# Patient Record
Sex: Male | Born: 1965 | Race: White | Hispanic: No | Marital: Married | Smoking: Never smoker
Health system: Southern US, Community
[De-identification: ages and names within clinical notes are randomized; demographics above are authoritative.]

## PROBLEM LIST (undated history)

## (undated) DIAGNOSIS — J45909 Unspecified asthma, uncomplicated: Secondary | ICD-10-CM

## (undated) DIAGNOSIS — T7840XA Allergy, unspecified, initial encounter: Secondary | ICD-10-CM

## (undated) DIAGNOSIS — R4681 Obsessive-compulsive behavior: Secondary | ICD-10-CM

---

## 2005-07-14 ENCOUNTER — Ambulatory Visit: Payer: Self-pay | Admitting: Gastroenterology

## 2009-06-30 DIAGNOSIS — D239 Other benign neoplasm of skin, unspecified: Secondary | ICD-10-CM

## 2009-06-30 HISTORY — DX: Other benign neoplasm of skin, unspecified: D23.9

## 2010-01-12 DIAGNOSIS — C4491 Basal cell carcinoma of skin, unspecified: Secondary | ICD-10-CM

## 2010-01-12 HISTORY — DX: Basal cell carcinoma of skin, unspecified: C44.91

## 2014-02-17 ENCOUNTER — Ambulatory Visit (INDEPENDENT_AMBULATORY_CARE_PROVIDER_SITE_OTHER): Payer: BC Managed Care – PPO | Admitting: Podiatry

## 2014-02-17 ENCOUNTER — Encounter: Payer: Self-pay | Admitting: Podiatry

## 2014-02-17 ENCOUNTER — Ambulatory Visit (INDEPENDENT_AMBULATORY_CARE_PROVIDER_SITE_OTHER): Payer: BC Managed Care – PPO

## 2014-02-17 ENCOUNTER — Ambulatory Visit: Payer: Self-pay | Admitting: Podiatry

## 2014-02-17 VITALS — BP 131/78 | HR 82 | Resp 16 | Ht 70.5 in | Wt 190.0 lb

## 2014-02-17 DIAGNOSIS — M21629 Bunionette of unspecified foot: Secondary | ICD-10-CM

## 2014-02-17 DIAGNOSIS — M205X9 Other deformities of toe(s) (acquired), unspecified foot: Secondary | ICD-10-CM

## 2014-02-17 DIAGNOSIS — M722 Plantar fascial fibromatosis: Secondary | ICD-10-CM

## 2014-02-17 NOTE — Progress Notes (Signed)
Patient ID: Shane Pacheco, male   DOB: 03/13/1965, 48 y.o.   MRN: [id number]  SUBJECTIVE: 48 year old male returns the office today requesting new orthotics. The patient states that previously he had been treated for plantar fasciitis and had orthotics made. He states the orthotics are worn out and likely new.. He states that he does not wear the orthotics he has no pain. He denies any pain at this time. The patient also states that at the end of the day he has pain on the outside aspects of his feet periodically for which she points to the fifth metatarsal head bilaterally. He states that the pain does not limit his activity and is intermittent. Denies any swelling or overall redness or warmth to the area. Denies any recent injury or trauma to the area. No other complaints at this time. Denies any systemic complaints such as fevers, chills, nausea, vomiting.  Objective: AAO x3, NAD DP/PT pulses palpable bilaterally, CRT less than 3 seconds Protective sensation intact with Simms Weinstein monofilament, vibratory sensation intact, Achilles tendon reflex intact No tenderness to palpation overlying the plantar medial tubercle of the calcaneus at the insertion of the plantar fascia was on the course of the plantar fascia. No pain with lateral compression of the calcaneus or pain with vibratory sensation. No pain along the posterior aspect of the calcaneus or along the course last insertion of the Achilles tendon. There is currently no tenderness to palpation overlying the fifth metatarsal head bilaterally. 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 No overlying edema, erythema, increase in warmth to the area. No areas of pinpoint bony tenderness or pain with vibratory sensation. MMT 5/5, ROM WNL No pain with calf compression, swelling, warmth, erythema. No open lesions or pre-ulcerative lesions  Assessment: 48 year old male with bilateral plantar fasciitis am a Production designer, theatre/television/film bunion  Plan: -X-rays  were obtained and the patient. -Treatment options were discussed including alternatives, risks, complications. -At this time, the patient is requesting custom orthotics. The patient was scanned for orthotics and sent to [facility] labs. -Discussed various padding/offloading options. -Follow-up once the new orthotics arrive. The meantime, call the office any questions, concerns, change in symptoms.

## 2014-03-17 ENCOUNTER — Ambulatory Visit: Payer: BC Managed Care – PPO | Admitting: Podiatry

## 2014-03-26 ENCOUNTER — Ambulatory Visit (INDEPENDENT_AMBULATORY_CARE_PROVIDER_SITE_OTHER): Payer: BLUE CROSS/BLUE SHIELD | Admitting: Podiatry

## 2014-03-26 DIAGNOSIS — M722 Plantar fascial fibromatosis: Secondary | ICD-10-CM

## 2014-03-26 NOTE — Patient Instructions (Signed)

## 2014-03-30 NOTE — Progress Notes (Signed)
Patient ID: Shane Pacheco, male   DOB: [birth year], 49 y.o.   MRN: [id number]  Patient presents today to pick up orthotics. He is offered to see myself however he declined he was seen by the nurse to dispense orthotics. No new complaints at this time and no complaints at this time. The appropriate break-in period was discussed with the patient. Follow-up in 4 weeks or sooner should any palms arise. Encouraged to call the office with any questions, concerns, change in symptoms.

## 2014-10-28 ENCOUNTER — Other Ambulatory Visit: Payer: Self-pay | Admitting: Medical

## 2014-10-28 ENCOUNTER — Ambulatory Visit
Admission: RE | Admit: 2014-10-28 | Discharge: 2014-10-28 | Disposition: A | Discharge status: Home / Self Care | Payer: BLUE CROSS/BLUE SHIELD | Source: Ambulatory Visit | Attending: Medical | Admitting: Medical

## 2014-10-28 DIAGNOSIS — R1011 Right upper quadrant pain: Secondary | ICD-10-CM | POA: Insufficient documentation

## 2015-02-18 ENCOUNTER — Encounter: Payer: Self-pay | Admitting: *Deleted

## 2015-02-19 ENCOUNTER — Ambulatory Visit: Payer: BLUE CROSS/BLUE SHIELD | Admitting: Certified Registered Nurse Anesthetist

## 2015-02-19 ENCOUNTER — Encounter
Admission: RE | Disposition: A | Discharge status: Home / Self Care | Payer: Self-pay | Source: Ambulatory Visit | Attending: Unknown Physician Specialty

## 2015-02-19 ENCOUNTER — Ambulatory Visit
Admission: RE | Admit: 2015-02-19 | Discharge: 2015-02-19 | Disposition: A | Discharge status: Home / Self Care | Payer: BLUE CROSS/BLUE SHIELD | Source: Ambulatory Visit | Attending: Unknown Physician Specialty | Admitting: Unknown Physician Specialty

## 2015-02-19 ENCOUNTER — Encounter: Payer: Self-pay | Admitting: Anesthesiology

## 2015-02-19 DIAGNOSIS — J45909 Unspecified asthma, uncomplicated: Secondary | ICD-10-CM | POA: Insufficient documentation

## 2015-02-19 DIAGNOSIS — F428 Other obsessive-compulsive disorder: Secondary | ICD-10-CM | POA: Diagnosis not present

## 2015-02-19 DIAGNOSIS — K640 First degree hemorrhoids: Secondary | ICD-10-CM | POA: Diagnosis not present

## 2015-02-19 DIAGNOSIS — Z882 Allergy status to sulfonamides status: Secondary | ICD-10-CM | POA: Insufficient documentation

## 2015-02-19 DIAGNOSIS — Z79899 Other long term (current) drug therapy: Secondary | ICD-10-CM | POA: Diagnosis not present

## 2015-02-19 DIAGNOSIS — Z1211 Encounter for screening for malignant neoplasm of colon: Secondary | ICD-10-CM | POA: Diagnosis present

## 2015-02-19 DIAGNOSIS — Z8371 Family history of colonic polyps: Secondary | ICD-10-CM | POA: Diagnosis not present

## 2015-02-19 HISTORY — DX: Unspecified asthma, uncomplicated: J45.909

## 2015-02-19 HISTORY — PX: COLONOSCOPY WITH PROPOFOL: SHX5780

## 2015-02-19 HISTORY — DX: Allergy, unspecified, initial encounter: T78.40XA

## 2015-02-19 HISTORY — DX: Obsessive-compulsive behavior: R46.81

## 2015-02-19 SURGERY — COLONOSCOPY WITH PROPOFOL
Anesthesia: General

## 2015-02-19 MED ORDER — FENTANYL CITRATE (PF) 100 MCG/2ML IJ SOLN
INTRAMUSCULAR | Status: DC | PRN
  Administered 2015-02-19: 50 ug via INTRAVENOUS

## 2015-02-19 MED ORDER — SODIUM CHLORIDE 0.9 % IV SOLN
INTRAVENOUS | Status: DC
  Administered 2015-02-19: 08:00:00 via INTRAVENOUS

## 2015-02-19 MED ORDER — SODIUM CHLORIDE 0.9 % IV SOLN: INTRAVENOUS | Status: DC

## 2015-02-19 MED ORDER — MIDAZOLAM HCL 2 MG/2ML IJ SOLN
INTRAMUSCULAR | Status: DC | PRN
  Administered 2015-02-19: 1 mg via INTRAVENOUS

## 2015-02-19 MED ORDER — PROPOFOL 10 MG/ML IV BOLUS
INTRAVENOUS | Status: DC | PRN
  Administered 2015-02-19: 10 mg via INTRAVENOUS
  Administered 2015-02-19: 20 mg via INTRAVENOUS
  Administered 2015-02-19: 10 mg via INTRAVENOUS

## 2015-02-19 MED ORDER — PROPOFOL 500 MG/50ML IV EMUL
INTRAVENOUS | Status: DC | PRN
  Administered 2015-02-19: 140 ug/kg/min via INTRAVENOUS

## 2015-02-19 MED ORDER — LIDOCAINE HCL (CARDIAC) 20 MG/ML IV SOLN
INTRAVENOUS | Status: DC | PRN
  Administered 2015-02-19: 100 mg via INTRAVENOUS

## 2015-02-19 NOTE — Op Note (Signed)
[facility] Gastroenterology Patient Name: Taiyo Kuder Procedure Date: 02/19/2015 8:01 AM MRN: [id number] Account #: [id number] Date of Birth: [birth year] Admit Type: Outpatient Age: 49 Room: [facility] ENDO ROOM 4 Gender: Male Note Status: Finalized Procedure:         Colonoscopy Indications:       Screening for colorectal malignant neoplasm Providers:         Manya Silvas, MD Referring MD:      Irven Easterly. Kary Kos, MD (Referring MD) Medicines:         Propofol per Anesthesia Complications:     No immediate complications. Procedure:         Pre-Anesthesia Assessment:                    - After reviewing the risks and benefits, the patient was                     deemed in satisfactory condition to undergo the procedure.                    After obtaining informed consent, the colonoscope was                     passed under direct vision. Throughout the procedure, the                     patient's blood pressure, pulse, and oxygen saturations                     were monitored continuously. The Colonoscope was                     introduced through the anus and advanced to the the cecum,                     identified by appendiceal orifice and ileocecal valve. The                     colonoscopy was performed without difficulty. The patient                     tolerated the procedure well. The quality of the bowel                     preparation was excellent. Findings:      Internal hemorrhoids were found during endoscopy. The hemorrhoids were       small and Grade I (internal hemorrhoids that do not prolapse).      Biopsies done of sigmoid colon for history of some diarrhea.      The exam was otherwise without abnormality. Impression:        - Internal hemorrhoids.                    - The examination was otherwise normal.                    - No specimens collected. Recommendation:    - Await pathology results.                    - Repeat colonoscopy in 5  years for screening purposes. Manya Silvas, MD 02/19/2015 8:27:31 AM This report has been signed electronically. Number of Addenda: 0 Note Initiated On: 02/19/2015 8:01 AM Scope Withdrawal Time: 0 hours 10  minutes 33 seconds  Total Procedure Duration: 0 hours 17 minutes 37 seconds       [facility]

## 2015-02-19 NOTE — Anesthesia Preprocedure Evaluation (Signed)
Anesthesia Evaluation  Patient identified by MRN, date of birth, ID band Patient awake    Reviewed: Allergy & Precautions, H&P , NPO status , Patient's Chart, lab work & pertinent test results  History of Anesthesia Complications Negative for: history of anesthetic complications  Airway Mallampati: III  TM Distance: >3 FB Neck ROM: full    Dental no notable dental hx. (+) Teeth Intact   Pulmonary neg shortness of breath, asthma ,    Pulmonary exam normal breath sounds clear to auscultation       Cardiovascular Exercise Tolerance: Good (-) angina(-) Past MI and (-) DOE negative cardio ROS Normal cardiovascular exam Rhythm:regular Rate:Normal     Neuro/Psych negative neurological ROS  negative psych ROS   GI/Hepatic Neg liver ROS, GERD  Controlled,  Endo/Other  negative endocrine ROS  Renal/GU negative Renal ROS  negative genitourinary   Musculoskeletal   Abdominal   Peds  Hematology negative hematology ROS (+)   Anesthesia Other Findings Past Medical History:   Allergic state                                               Asthma without status asthmaticus                            Obsessive behavior                                          History reviewed. No pertinent surgical history.     Reproductive/Obstetrics negative OB ROS                             Anesthesia Physical Anesthesia Plan  ASA: III  Anesthesia Plan: General   Post-op Pain Management:    Induction:   Airway Management Planned:   Additional Equipment:   Intra-op Plan:   Post-operative Plan:   Informed Consent: I have reviewed the patients History and Physical, chart, labs and discussed the procedure including the risks, benefits and alternatives for the proposed anesthesia with the patient or authorized representative who has indicated his/her understanding and acceptance.   Dental Advisory  Given  Plan Discussed with: Anesthesiologist, CRNA and Surgeon  Anesthesia Plan Comments:         Anesthesia Quick Evaluation

## 2015-02-19 NOTE — Anesthesia Postprocedure Evaluation (Signed)
Anesthesia Post Note  Patient: Shane Pacheco  Procedure(s) Performed: Procedure(s) (LRB): COLONOSCOPY WITH PROPOFOL (N/A)  Patient location during evaluation: Endoscopy Anesthesia Type: General Level of consciousness: awake and alert Pain management: pain level controlled Vital Signs Assessment: post-procedure vital signs reviewed and stable Respiratory status: spontaneous breathing, nonlabored ventilation, respiratory function stable and patient connected to nasal cannula oxygen Cardiovascular status: blood pressure returned to baseline and stable Postop Assessment: no signs of nausea or vomiting Anesthetic complications: no    Last Vitals:  Filed Vitals:   02/19/15 0850 02/19/15 0900  BP: 118/73 123/68  Pulse: 67 66  Temp:    Resp: 13 16    Last Pain: There were no vitals filed for this visit.               Precious Haws Tucker Minter

## 2015-02-19 NOTE — H&P (Addendum)
Primary Care Physician:  Maryland Pink, MD Primary Gastroenterologist:  Dr. Vira Agar  Pre-Procedure History & Physical: HPI:  Shane Pacheco is a 49 y.o. male is here for an colonoscopy.   Past Medical History  Diagnosis Date  . Allergic state   . Asthma without status asthmaticus   . Obsessive behavior     History reviewed. No pertinent past surgical history.  Prior to Admission medications   Medication Sig Start Date End Date Taking? Authorizing Provider  Azelastine HCl (ASTEPRO) 0.15 % SOLN  07/08/10  Yes Historical Provider, MD  Fexofenadine HCl (ALLEGRA PO) Take by mouth daily.   Yes Historical Provider, MD  FLUoxetine (PROZAC) 20 MG tablet Take 20 mg by mouth daily.   Yes Historical Provider, MD  montelukast (SINGULAIR) 10 MG tablet  06/04/10  Yes Historical Provider, MD  pantoprazole (PROTONIX) 40 MG tablet  12/06/13  Yes Historical Provider, MD  mometasone (NASONEX) 50 MCG/ACT nasal spray  06/04/10   Historical Provider, MD    Allergies as of 09/25/2014 - Review Complete 02/17/2014  Allergen Reaction Noted  . Sulfa antibiotics Other (See Comments) 02/17/2014    History reviewed. No pertinent family history.  Social History   Social History  . Marital Status: Married    Spouse Name: N/A  . Number of Children: N/A  . Years of Education: N/A   Occupational History  . Not on file.   Social History Main Topics  . Smoking status: Never Smoker   . Smokeless tobacco: Not on file  . Alcohol Use: 0.0 oz/week    0 Standard drinks or equivalent per week  . Drug Use: Not on file  . Sexual Activity: Not on file   Other Topics Concern  . Not on file   Social History Narrative    Review of Systems: See HPI, otherwise negative ROS  Physical Exam: BP 140/77 mmHg  Pulse 80  Temp(Src) 98.5 F (36.9 C) (Oral)  Resp 17  SpO2 98% General:   Alert,  pleasant and cooperative in NAD Head:  Normocephalic and atraumatic. Neck:  Supple; no masses or  thyromegaly. Lungs:  Clear throughout to auscultation.    Heart:  Regular rate and rhythm. Abdomen:  Soft, nontender and nondistended. Normal bowel sounds, without guarding, and without rebound.   Neurologic:  Alert and  oriented x4;  grossly normal neurologically.  Impression/Plan: Shane Pacheco is here for an colonoscopy to be performed for screening   Risks, benefits, limitations, and alternatives regarding  colonoscopy have been reviewed with the patient.  Questions have been answered.  All parties agreeable.   Gaylyn Cheers, MD  02/19/2015, 7:51 AM    Primary Care Physician:  Maryland Pink, MD Primary Gastroenterologist:  Dr. Vira Agar  Pre-Procedure History & Physical: HPI:  Shane Pacheco is a 49 y.o. male is here for an colonoscopy.   Past Medical History  Diagnosis Date  . Allergic state   . Asthma without status asthmaticus   . Obsessive behavior     History reviewed. No pertinent past surgical history.  Prior to Admission medications   Medication Sig Start Date End Date Taking? Authorizing Provider  Azelastine HCl (ASTEPRO) 0.15 % SOLN  07/08/10  Yes Historical Provider, MD  Fexofenadine HCl (ALLEGRA PO) Take by mouth daily.   Yes Historical Provider, MD  FLUoxetine (PROZAC) 20 MG tablet Take 20 mg by mouth daily.   Yes Historical Provider, MD  montelukast (SINGULAIR) 10 MG tablet  06/04/10  Yes Historical Provider, MD  pantoprazole ([place])  40 MG tablet  12/06/13  Yes Historical Provider, MD  mometasone (NASONEX) 50 MCG/ACT nasal spray  06/04/10   Historical Provider, MD    Allergies as of 09/25/2014 - Review Complete 02/17/2014  Allergen Reaction Noted  . Sulfa antibiotics Other (See Comments) 02/17/2014    History reviewed. No pertinent family history.  Social History   Social History  . Marital Status: Married    Spouse Name: N/A  . Number of Children: N/A  . Years of Education: N/A   Occupational History  . Not on file.   Social History Main  Topics  . Smoking status: Never Smoker   . Smokeless tobacco: Not on file  . Alcohol Use: 0.0 oz/week    0 Standard drinks or equivalent per week  . Drug Use: Not on file  . Sexual Activity: Not on file   Other Topics Concern  . Not on file   Social History Narrative    Review of Systems: See HPI, otherwise negative ROS  Physical Exam: BP 140/77 mmHg  Pulse 80  Temp(Src) 98.5 F (36.9 C) (Oral)  Resp 17  SpO2 98% General:   Alert,  pleasant and cooperative in NAD Head:  Normocephalic and atraumatic. Neck:  Supple; no masses or thyromegaly. Lungs:  Clear throughout to auscultation.    Heart:  Regular rate and rhythm. Abdomen:  Soft, nontender and nondistended. Normal bowel sounds, without guarding, and without rebound.   Neurologic:  Alert and  oriented x4;  grossly normal neurologically.  Impression/Plan: Shane Pacheco is here for an colonoscopy to be performed for screening, FH colon polyps in mother.  Risks, benefits, limitations, and alternatives regarding  colonoscopy have been reviewed with the patient.  Questions have been answered.  All parties agreeable.   Gaylyn Cheers, MD  02/19/2015, 7:51 AM

## 2015-02-19 NOTE — Transfer of Care (Signed)
Immediate Anesthesia Transfer of Care Note  Patient: Shane Pacheco  Procedure(s) Performed: Procedure(s): COLONOSCOPY WITH PROPOFOL (N/A)  Patient Location: PACU  Anesthesia Type:General  Level of Consciousness: sedated  Airway & Oxygen Therapy: Patient Spontanous Breathing and Patient connected to nasal cannula oxygen  Post-op Assessment: Report given to RN and Post -op Vital signs reviewed and stable  Post vital signs: Reviewed and stable  Last Vitals:  Filed Vitals:   02/19/15 0744  BP: 140/77  Pulse: 80  Temp: 36.9 C  Resp: 17    Complications: No apparent anesthesia complications

## 2015-02-19 NOTE — Anesthesia Procedure Notes (Signed)
Date/Time: 02/19/2015 8:03 AM Performed by: Johnna Acosta Pre-anesthesia Checklist: Patient identified, Emergency Drugs available, Suction available, Patient being monitored and Timeout performed Patient Re-evaluated:Patient Re-evaluated prior to inductionOxygen Delivery Method: Nasal cannula

## 2015-02-23 LAB — SURGICAL PATHOLOGY

## 2015-02-24 ENCOUNTER — Encounter: Payer: Self-pay | Admitting: Unknown Physician Specialty

## 2016-09-05 IMAGING — CR DG ABDOMEN 1V
1 series · 2 of 2 positions shown · non-contrast
Comparison: None.

CLINICAL DATA: Right-sided abdomen pain for 4 days

EXAM:
ABDOMEN - 1 VIEW

[Series 1: dg abd 1 view · 0.14mm/px · 2 of 2 slices shown]
[im 1/2]
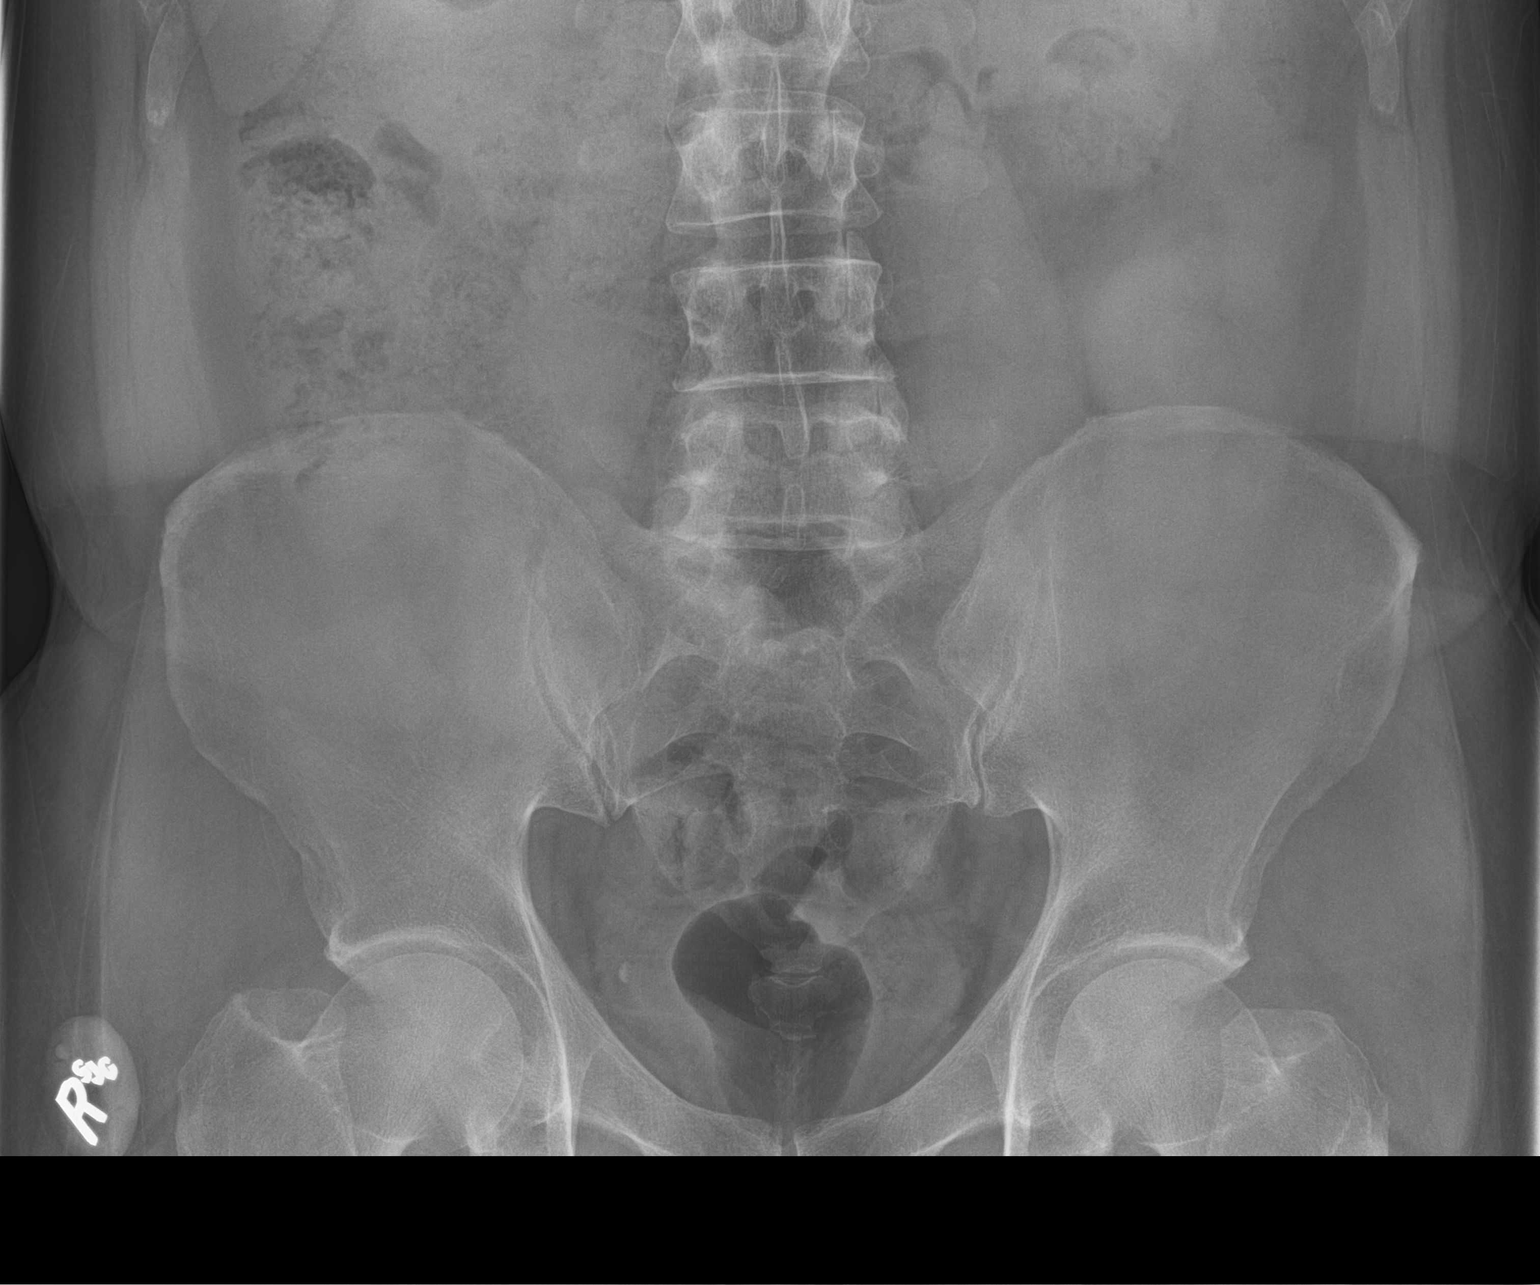
[im 2/2]
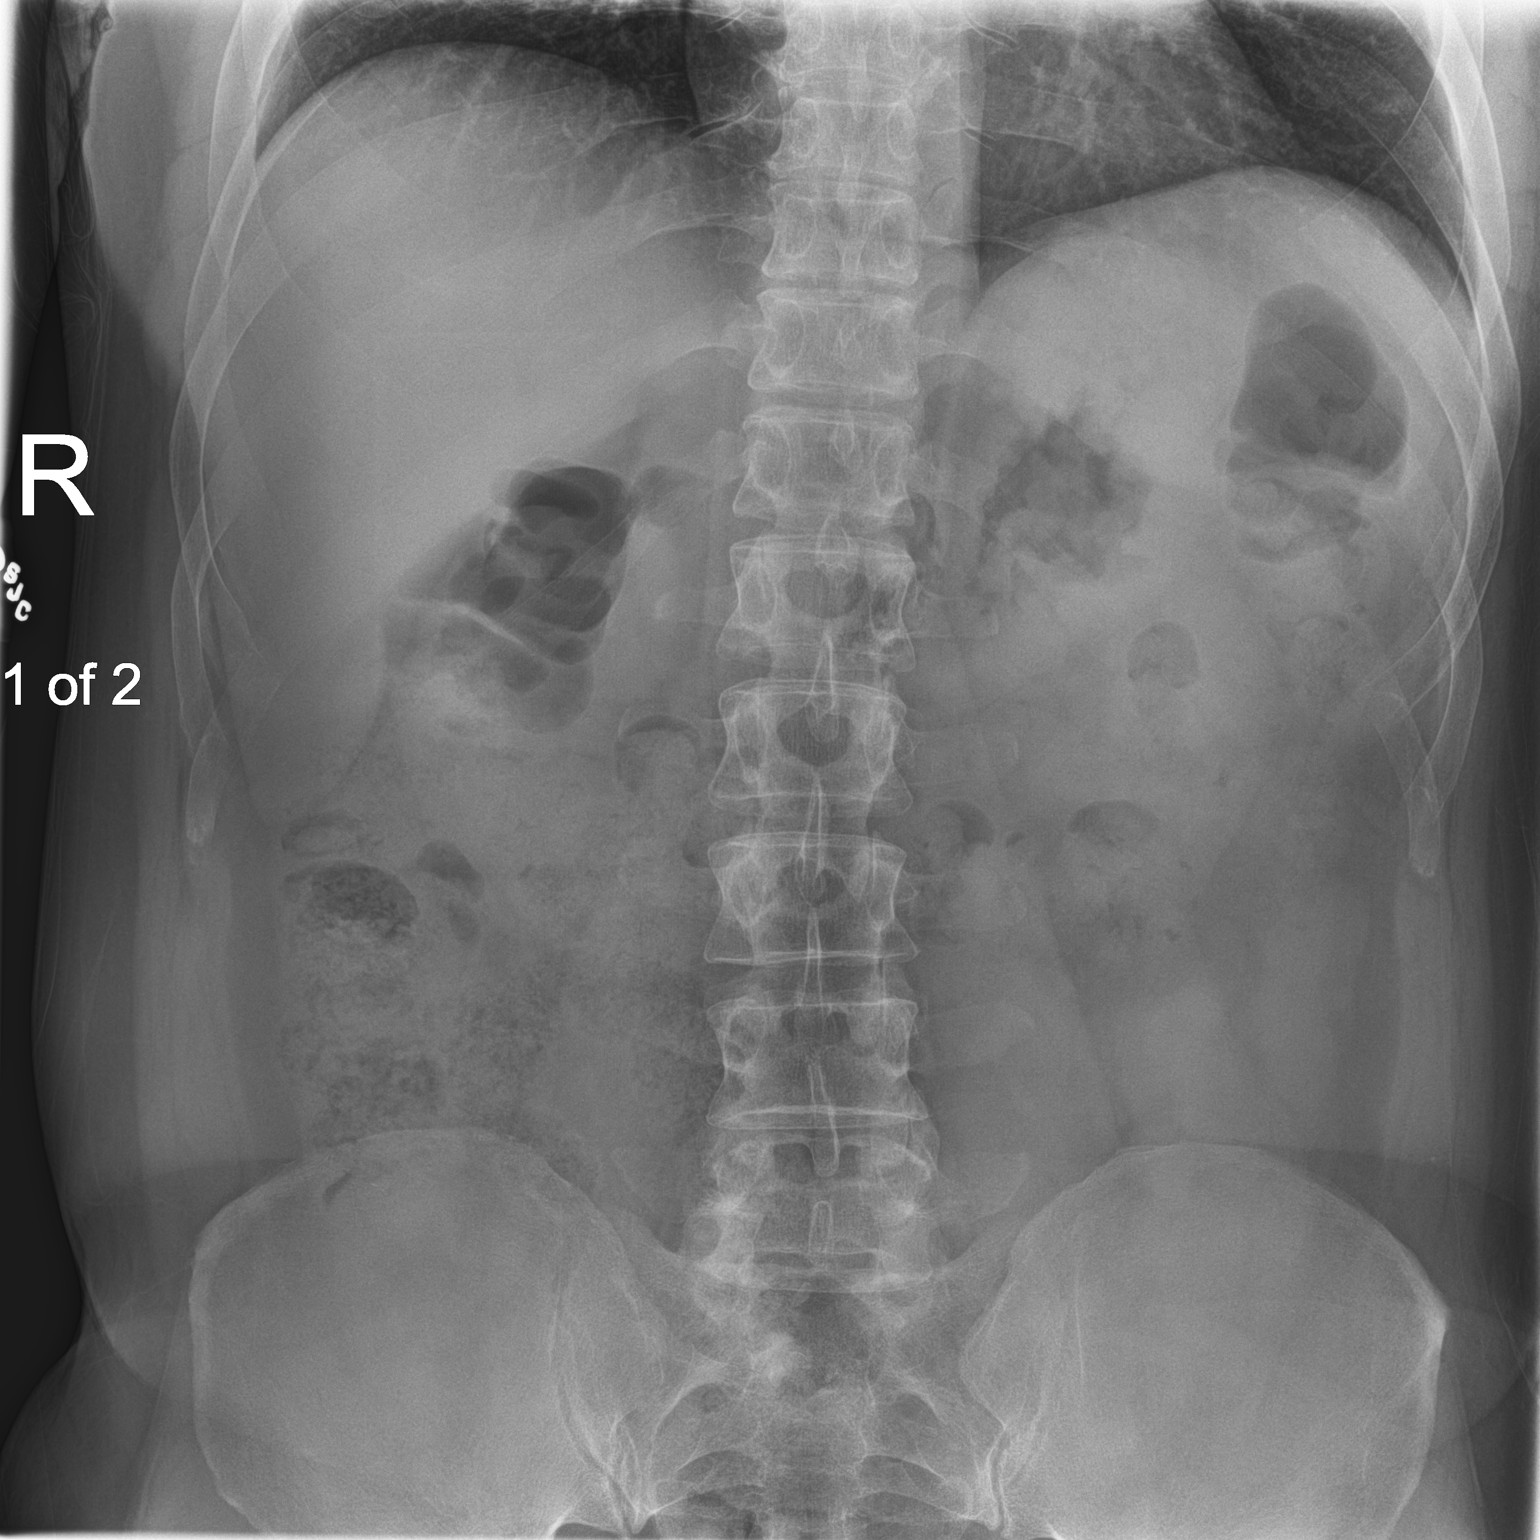

[2 of 2 positions shown; findings below may reference images not displayed]

FINDINGS: Supine views of the abdomen show no bowel obstruction. Only a mild
to moderate amount of feces is noted throughout the colon. No opaque
calculi are seen. No bony abnormality is noted.
IMPRESSION: No bowel obstruction.  No radiographic evidence of constipation.

## 2016-12-29 ENCOUNTER — Encounter: Payer: Self-pay | Admitting: Adult Health

## 2016-12-29 ENCOUNTER — Ambulatory Visit: Payer: BLUE CROSS/BLUE SHIELD | Admitting: Adult Health

## 2016-12-29 VITALS — BP 150/94 | HR 79 | Temp 98.0°F | Resp 16 | Wt 194.0 lb

## 2016-12-29 DIAGNOSIS — J0100 Acute maxillary sinusitis, unspecified: Secondary | ICD-10-CM

## 2016-12-29 MED ORDER — AMOXICILLIN-POT CLAVULANATE 875-125 MG PO TABS: 1.0000 | ORAL_TABLET | Freq: Two times a day (BID) | ORAL | 0 refills | Status: DC

## 2016-12-29 NOTE — Progress Notes (Addendum)
Subjective:     Patient ID: Shane Pacheco, male   DOB: [birth year], 51 y.o.   MRN: [id number]  HPI  Patient is a 51 year old male in no acute distress who presents with a head cold over two weeks ago. [facility] with [facility] throat took Prednisone 2 tablets daily for 5 days. Minimal nasal discharge yellow/ green mostly clear. Sore throat.   He has been taking Mucinex 600 mg QID.  Taking Nyquil at night. He denies any history of chronic sinusitis or any recent illness or hospitalizations.   Blood pressure (!) 150/94, pulse 79, temperature 98 F (36.7 C), resp. rate 16, weight 194 lb (88 kg), SpO2 99 %.  Recheck B/P 136/72.  He is advised to monitor at home and report to Maryland Pink, MD. He reports his blood pressure is usually 126/70.    Allergies  Allergen Reactions  . Sulfa Antibiotics Other (See Comments)    Current Outpatient Medications:  .  Azelastine HCl (ASTEPRO) 0.15 % SOLN, , Disp: , Rfl:  .  Fexofenadine HCl (ALLEGRA PO), Take by mouth daily., Disp: , Rfl:  .  FLUoxetine (PROZAC) 20 MG tablet, Take 20 mg by mouth daily., Disp: , Rfl:  .  levalbuterol (XOPENEX HFA) 45 MCG/ACT inhaler, , Disp: , Rfl:  .  mometasone (NASONEX) 50 MCG/ACT nasal spray, , Disp: , Rfl:  .  montelukast (SINGULAIR) 10 MG tablet, , Disp: , Rfl:  .  montelukast (SINGULAIR) 10 MG tablet, , Disp: , Rfl:  .  pantoprazole (PROTONIX) 40 MG tablet, , Disp: , Rfl: 1 .  Multiple Vitamin (MULTIVITAMIN) capsule, Take by mouth., Disp: , Rfl:  .  Omega-3 Fatty Acids (FISH OIL PO), Take by mouth., Disp: , Rfl:  .  predniSONE (DELTASONE) 20 MG tablet, TAKE 2 TABLETS BY MOUTH EVERY DAY FOR 5 DAYS, Disp: , Rfl: 0  Review of Systems  Constitutional: Negative.   HENT: Positive for congestion, sinus pressure, sore throat and voice change (hoarse ).   Eyes: Negative.   Respiratory: Negative.   Cardiovascular: Negative.   Gastrointestinal: Negative.   Endocrine: Negative.    Genitourinary: Negative.   Musculoskeletal: Negative.   Skin: Negative.   Allergic/Immunologic: Positive for environmental allergies. Negative for food allergies and immunocompromised state.       Environmental allergies current treatment regimen controlling   Neurological: Negative.   Hematological: Negative.  Negative for adenopathy.  Psychiatric/Behavioral: Negative.        Objective:   Physical Exam  Constitutional: He is oriented to person, place, and time. Vital signs are normal. He appears well-developed and well-nourished. He is active.  Non-toxic appearance. He does not have a sickly appearance. He does not appear ill. No distress.  HENT:  Head: Normocephalic and atraumatic.  Right Ear: Hearing, external ear and ear canal normal. Tympanic membrane is not perforated and not erythematous.  Left Ear: Hearing, tympanic membrane, external ear and ear canal normal. Tympanic membrane is not perforated and not erythematous.  Nose: Mucosal edema and rhinorrhea present. No nose lacerations, sinus tenderness or nasal deformity. Right sinus exhibits maxillary sinus tenderness. Right sinus exhibits no frontal sinus tenderness. Left sinus exhibits maxillary sinus tenderness. Left sinus exhibits no frontal sinus tenderness.  Mouth/Throat: Uvula is midline and mucous membranes are normal. Mucous membranes are not pale, not dry and not cyanotic. No oral lesions. No uvula swelling. Posterior oropharyngeal erythema present. No oropharyngeal exudate, posterior oropharyngeal edema or tonsillar abscesses.  Eyes: Conjunctivae, EOM and lids are normal. Pupils are equal, round, and reactive to light. Right eye exhibits no discharge. Left eye exhibits no discharge. No scleral icterus.  Neck: Trachea normal, normal range of motion, full passive range of motion without pain and phonation normal. Neck supple. Normal carotid pulses, no hepatojugular reflux and no JVD present. No tracheal tenderness present.  Carotid bruit is not present. No tracheal deviation present. No Brudzinski's sign noted. No thyromegaly present.  Cardiovascular: Normal rate, regular rhythm, S1 normal, S2 normal, normal heart sounds, intact distal pulses and normal pulses. Exam reveals no gallop, no distant heart sounds and no friction rub.  No murmur heard. Pulmonary/Chest: Effort normal and breath sounds normal. No accessory muscle usage or stridor. No respiratory distress. He has no wheezes. He has no rales. He exhibits no tenderness.  Abdominal: Soft. Normal appearance, normal aorta and bowel sounds are normal. He exhibits no distension and no mass. There is no tenderness. There is no rebound and no guarding.  Musculoskeletal: Normal range of motion. He exhibits no edema, tenderness or deformity.  Patient moves on and off of exam table and in room without difficulty. Gait is normal in hall and in room. Patient is oriented to person place time and situation. Patient answers questions appropriately and engages in conversation.   Lymphadenopathy:       Head (right side): No submental, no submandibular, no tonsillar, no preauricular, no posterior auricular and no occipital adenopathy present.       Head (left side): No submental, no submandibular, no tonsillar, no preauricular, no posterior auricular and no occipital adenopathy present.    He has no cervical adenopathy.    He has no axillary adenopathy.  Neurological: He is alert and oriented to person, place, and time. He has normal strength and normal reflexes. No cranial nerve deficit or sensory deficit. He exhibits normal muscle tone. He displays a negative Romberg sign. Coordination normal. GCS eye subscore is 4. GCS verbal subscore is 5. GCS motor subscore is 6.  Skin: Skin is warm, dry and intact. No rash noted. He is not diaphoretic. No cyanosis or erythema. No pallor. Nails show no clubbing.  Psychiatric: He has a normal mood and affect. His speech is normal and behavior is  normal. Judgment and thought content normal. Cognition and memory are normal.       Assessment:  Acute non-recurrent maxillary sinusitis       Plan:     Meds ordered this encounter  Medications  .  .   .    .   .  Marland Kitchen amoxicillin-clavulanate (AUGMENTIN) 875-125 MG tablet    Sig: Take 1 tablet 2 (two) times daily by mouth.    Dispense:  20 tablet    Refill:  0   Augmentin only E- prescribed as above.   Advised to return to clinic for an appointment if no improvement within 72 hours or if any symptoms change or worsen. Advised ER or urgent Care if after hours or on weekend. 911 for emergency symptoms at any time. You should not worsen from today forward and should seek care if you do.   Patient verbalized understanding of all instructions given and denies any further questions at this time.

## 2016-12-29 NOTE — Patient Instructions (Signed)
Sinusitis, Adult Sinusitis is soreness and inflammation of your sinuses. Sinuses are hollow spaces in the bones around your face. They are located:  Around your eyes.  In the middle of your forehead.  Behind your nose.  In your cheekbones.  Your sinuses and nasal passages are lined with a stringy fluid (mucus). Mucus normally drains out of your sinuses. When your nasal tissues get inflamed or swollen, the mucus can get trapped or blocked so air cannot flow through your sinuses. This lets bacteria, viruses, and funguses grow, and that leads to infection. Follow these instructions at home: Medicines  Take, use, or apply over-the-counter and prescription medicines only as told by your doctor. These may include nasal sprays.  If you were prescribed an antibiotic medicine, take it as told by your doctor. Do not stop taking the antibiotic even if you start to feel better. Hydrate and Humidify  Drink enough water to keep your pee (urine) clear or pale yellow.  Use a cool mist humidifier to keep the humidity level in your home above 50%.  Breathe in steam for 10-15 minutes, 3-4 times a day or as told by your doctor. You can do this in the bathroom while a hot shower is running.  Try not to spend time in cool or dry air. Rest  Rest as much as possible.  Sleep with your head raised (elevated).  Make sure to get enough sleep each night. General instructions  Put a warm, moist washcloth on your face 3-4 times a day or as told by your doctor. This will help with discomfort.  Wash your hands often with soap and water. If there is no soap and water, use hand sanitizer.  Do not smoke. Avoid being around people who are smoking (secondhand smoke).  Keep all follow-up visits as told by your doctor. This is important. Contact a doctor if:  You have a fever.  Your symptoms get worse.  Your symptoms do not get better within 10 days. Get help right away if:  You have a very bad  headache.  You cannot stop throwing up (vomiting).  You have pain or swelling around your face or eyes.  You have trouble seeing.  You feel confused.  Your neck is stiff.  You have trouble breathing. This information is not intended to replace advice given to you by your health care provider. Make sure you discuss any questions you have with your health care provider. Document Released: 07/26/2007 Document Revised: 10/03/2015 Document Reviewed: 12/02/2014 Elsevier Interactive Patient Education  2018 [employer]. Sinus Rinse What is a sinus rinse? A sinus rinse is a simple home treatment that is used to rinse your sinuses with a sterile mixture of salt and water (saline solution). Sinuses are air-filled spaces in your skull behind the bones of your face and forehead that open into your nasal cavity. You will use the following:  Saline solution.  Neti pot or spray bottle. This releases the saline solution into your nose and through your sinuses. Neti pots and spray bottles can be purchased at [employer], a health food store, or online.  When would I do a sinus rinse? A sinus rinse can help to clear mucus, dirt, dust, or pollen from the nasal cavity. You may do a sinus rinse when you have a cold, a virus, nasal allergy symptoms, a sinus infection, or stuffiness in the nose or sinuses. If you are considering a sinus rinse:  Ask your child's health care provider before performing  a sinus rinse on your child.  Do not do a sinus rinse if you have had ear or nasal surgery, ear infection, or blocked ears.  How do I do a sinus rinse?  Wash your hands.  Disinfect your device according to the directions provided and then dry it.  Use the solution that comes with your device or one that is sold separately in stores. Follow the mixing directions on the package.  Fill your device with the amount of saline solution as directed by the device instructions.  Stand over a sink and  tilt your head sideways over the sink.  Place the spout of the device in your upper nostril (the one closer to the ceiling).  Gently pour or squeeze the saline solution into the nasal cavity. The liquid should drain to the lower nostril if you are not overly congested.  Gently blow your nose. Blowing too hard may cause ear pain.  Repeat in the other nostril.  Clean and rinse your device with clean water and then air-dry it. Are there risks of a sinus rinse? Sinus rinse is generally very safe and effective. However, there are a few risks, which include:  A burning sensation in the sinuses. This may happen if you do not make the saline solution as directed. Make sure to follow all directions when making the saline solution.  Infection from contaminated water. This is rare, but possible.  Nasal irritation.  This information is not intended to replace advice given to you by your health care provider. Make sure you discuss any questions you have with your health care provider. Document Released: 09/03/2013 Document Revised: 01/04/2016 Document Reviewed: 06/24/2013 Elsevier Interactive Patient Education  2017 [employer].

## 2017-01-01 ENCOUNTER — Telehealth: Payer: Self-pay

## 2017-01-01 ENCOUNTER — Other Ambulatory Visit: Payer: Self-pay | Admitting: Medical

## 2017-01-01 MED ORDER — AMOXICILLIN 875 MG PO TABS
875.0000 mg | ORAL_TABLET | Freq: Two times a day (BID) | ORAL | 0 refills | Status: DC
Start: 2017-01-01 — End: 2017-06-08

## 2017-01-01 NOTE — Telephone Encounter (Signed)
Per Heather Ratcliffe's instuctions, called patient advised him to stop taking Augmentin since it was causing GI upset and diarrhea.  New prescription for amoxil has been sent to his pharmacy, [facility] on [address]  He was appreciative and says sinus infection is improving.

## 2017-01-01 NOTE — Telephone Encounter (Signed)
Pt called and states Augmentin he was prescribed on Friday is giving him diarrhea; states he is eating something prior to taking as directed and wants something else called in; H. Ratcliffe notified

## 2017-01-01 NOTE — Telephone Encounter (Signed)
Patient called and spoke to [facility] RN , says  Augmentin is causing diarrhea and he wants something different called in.  Will call in Amoxil .  Meds ordered this encounter  Medications  . amoxicillin (AMOXIL) 875 MG tablet    Sig: Take 1 tablet (875 mg total) 2 (two) times daily by mouth.    Dispense:  20 tablet    Refill:  0   Patient to return to clinc in 3-5 days if not improviing. Try OTC Imodium take as directed for diarrhea.

## 2017-06-08 ENCOUNTER — Ambulatory Visit: Payer: BLUE CROSS/BLUE SHIELD | Admitting: Adult Health

## 2017-06-08 ENCOUNTER — Encounter: Payer: Self-pay | Admitting: Adult Health

## 2017-06-08 VITALS — BP 145/82 | HR 62 | Temp 99.4°F | Resp 16 | Ht 67.0 in | Wt 197.0 lb

## 2017-06-08 DIAGNOSIS — J40 Bronchitis, not specified as acute or chronic: Secondary | ICD-10-CM

## 2017-06-08 MED ORDER — AZITHROMYCIN 250 MG PO TABS: ORAL_TABLET | ORAL | 0 refills | Status: AC

## 2017-06-08 MED ORDER — PREDNISONE 10 MG (21) PO TBPK: ORAL_TABLET | ORAL | 0 refills | Status: AC

## 2017-06-08 NOTE — Progress Notes (Signed)
Subjective:     Patient ID: Shane Pacheco, male   DOB: March 03, 1965, 52 y.o.   MRN: [id number]   HPI   Blood pressure (!) 145/82, pulse 62, temperature 99.4 F (37.4 C), resp. rate 16, height 5\' 7"  (1.702 m), weight 197 lb (89.4 kg), SpO2 97 %. Patient is a 52 year old make with chest congestin who comes to the clinic in no acute distress. He reports he has been sick with cold since April  Improved about one week then had chest congestion return.   Patient  denies any fever, body aches,chills, rash, chest pain, shortness of breath, nausea, vomiting, or diarrhea.    Review of Systems  Constitutional: Positive for fever (in office today 99.4 F - he denies at home fevers or chills ). Negative for activity change, appetite change, chills, diaphoresis, fatigue and unexpected weight change.  HENT: Positive for ear pain (none today at initial sickness in April). Negative for congestion.   Eyes: Negative.   Respiratory: Positive for cough and chest tightness (mild ). Negative for apnea, choking, shortness of breath, wheezing and stridor.   Cardiovascular: Negative.   Gastrointestinal: Negative.   Genitourinary: Negative.   Musculoskeletal: Negative.   Skin: Negative.   Allergic/Immunologic: Negative.   Neurological: Negative.   Hematological: Negative.   Psychiatric/Behavioral: Negative.        Objective:   Physical Exam  Constitutional: He is oriented to person, place, and time. Vital signs are normal. He appears well-developed and well-nourished. He is active.  Non-toxic appearance. He does not have a sickly appearance. He does not appear ill. No distress. He is not intubated.  Patient is alert and oriented and responsive to questions Engages in eye contact with provider. Speaks in full sentences without any pauses without any shortness of breath.   Patient moves on and off of exam table and in room without difficulty. Gait is normal in hall and in room. Patient is oriented to person  place time and situation. Patient answers questions appropriately and engages in conversation.   HENT:  Head: Normocephalic and atraumatic.  Right Ear: Hearing, tympanic membrane, external ear and ear canal normal. Tympanic membrane is not perforated and not erythematous.  Left Ear: Hearing, tympanic membrane, external ear and ear canal normal. Tympanic membrane is not perforated and not erythematous.  Nose: Rhinorrhea present. No mucosal edema. Right sinus exhibits no maxillary sinus tenderness and no frontal sinus tenderness. Left sinus exhibits no maxillary sinus tenderness and no frontal sinus tenderness.  Mouth/Throat: Uvula is midline, oropharynx is clear and moist and mucous membranes are normal. No oropharyngeal exudate, posterior oropharyngeal edema, posterior oropharyngeal erythema or tonsillar abscesses.  Eyes: Pupils are equal, round, and reactive to light. Conjunctivae and EOM are normal. Right eye exhibits no discharge. Left eye exhibits no discharge. No scleral icterus.  Neck: Trachea normal, normal range of motion, full passive range of motion without pain and phonation normal. Neck supple. Normal carotid pulses, no hepatojugular reflux and no JVD present. Carotid bruit is not present. No tracheal deviation present. No Brudzinski's sign noted.  Cardiovascular: Normal rate, regular rhythm, S1 normal, S2 normal, normal heart sounds and intact distal pulses. Exam reveals no gallop, no distant heart sounds and no friction rub.  No murmur heard. Pulmonary/Chest: Effort normal and breath sounds normal. No accessory muscle usage or stridor. No apnea, no tachypnea and no bradypnea. He is not intubated. No respiratory distress. He has no decreased breath sounds. He has no wheezes. He has no rhonchi.  He has no rales. He exhibits no tenderness.  Bronchial cough and bronchial congestion audible by ear and with stethoscope anterior chest over bronchial region.  No shortness of breath or use of  accessory muscles/.   Abdominal: Soft. Bowel sounds are normal.  Musculoskeletal: Normal range of motion.  Lymphadenopathy:       Head (right side): No submental, no submandibular, no tonsillar, no preauricular, no posterior auricular and no occipital adenopathy present.       Head (left side): No submental, no submandibular, no tonsillar, no preauricular, no posterior auricular and no occipital adenopathy present.    He has no cervical adenopathy.       Right cervical: No superficial cervical, no deep cervical and no posterior cervical adenopathy present.      Left cervical: No superficial cervical, no deep cervical and no posterior cervical adenopathy present.    He has no axillary adenopathy.  Neurological: He is alert and oriented to person, place, and time. He displays normal reflexes. No cranial nerve deficit. He exhibits normal muscle tone. Coordination normal.  Skin: Skin is warm and dry. No rash noted. He is not diaphoretic. No erythema. No pallor.  Psychiatric: He has a normal mood and affect. His behavior is normal. Judgment and thought content normal.  Vitals reviewed.      Assessment:     Bronchitis      Plan:     Meds ordered this encounter  Medications  . predniSONE (STERAPRED UNI-PAK 21 TAB) 10 MG (21) TBPK tablet    Sig: By mouth Take 6 tablets on day 1, Take 5 tablets day 2 Take 4 tablets day 3 Take 3 tablets day 4 Take 2 tablets day five 5 Take 1 tablet day    Dispense:  21 tablet    Refill:  0  . azithromycin (ZITHROMAX) 250 MG tablet    Sig: By mouth Take 2 tablets day 1 (500mg  total) and 1 tablet ( 250 mg ) days 2,3,4,5.    Dispense:  6 tablet    Refill:  0  Follow up with Maryland Pink, MD for blood pressure recheck.He reports he recently had PCP check and no need for medication at this time per patient- he reports it runs normal readings at home.  Follow up with PCP if symptoms persist or return.   Advised patient call the office or your primary care  doctor for an appointment if no improvement within 72 hours or if any symptoms change or worsen at any time  Advised ER or urgent Care if after hours or on weekend. Call 911 for emergency symptoms at any time.Patinet verbalized understanding of all instructions given/reviewed and treatment plan and has no further questions or concerns at this time.    Patient verbalized understanding of all instructions given and denies any further questions at this time.

## 2017-06-08 NOTE — Patient Instructions (Signed)
Azithromycin tablets What is this medicine? AZITHROMYCIN (az ith roe MYE sin) is a macrolide antibiotic. It is used to treat or prevent certain kinds of bacterial infections. It will not work for colds, flu, or other viral infections. This medicine may be used for other purposes; ask your health care provider or pharmacist if you have questions. COMMON BRAND NAME(S): Zithromax, Zithromax Tri-Pak, Zithromax Z-Pak What should I tell my health care provider before I take this medicine? They need to know if you have any of these conditions: -kidney disease -liver disease -irregular heartbeat or heart disease -an unusual or allergic reaction to azithromycin, erythromycin, other macrolide antibiotics, foods, dyes, or preservatives -pregnant or trying to get pregnant -breast-feeding How should I use this medicine? Take this medicine by mouth with a full glass of water. Follow the directions on the prescription label. The tablets can be taken with food or on an empty stomach. If the medicine upsets your stomach, take it with food. Take your medicine at regular intervals. Do not take your medicine more often than directed. Take all of your medicine as directed even if you think your are better. Do not skip doses or stop your medicine early. Talk to your pediatrician regarding the use of this medicine in children. While this drug may be prescribed for children as young as 6 months for selected conditions, precautions do apply. Overdosage: If you think you have taken too much of this medicine contact a poison control center or emergency room at once. NOTE: This medicine is only for you. Do not share this medicine with others. What if I miss a dose? If you miss a dose, take it as soon as you can. If it is almost time for your next dose, take only that dose. Do not take double or extra doses. What may interact with this medicine? Do not take this medicine with any of the following  medications: -lincomycin This medicine may also interact with the following medications: -amiodarone -antacids -birth control pills -cyclosporine -digoxin -magnesium -nelfinavir -phenytoin -warfarin This list may not describe all possible interactions. Give your health care provider a list of all the medicines, herbs, non-prescription drugs, or dietary supplements you use. Also tell them if you smoke, drink alcohol, or use illegal drugs. Some items may interact with your medicine. What should I watch for while using this medicine? Tell your doctor or healthcare professional if your symptoms do not start to get better or if they get worse. Do not treat diarrhea with over the counter products. Contact your doctor if you have diarrhea that lasts more than 2 days or if it is severe and watery. This medicine can make you more sensitive to the sun. Keep out of the sun. If you cannot avoid being in the sun, wear protective clothing and use sunscreen. Do not use sun lamps or tanning beds/booths. What side effects may I notice from receiving this medicine? Side effects that you should report to your doctor or health care professional as soon as possible: -allergic reactions like skin rash, itching or hives, swelling of the face, lips, or tongue -confusion, nightmares or hallucinations -dark urine -difficulty breathing -hearing loss -irregular heartbeat or chest pain -pain or difficulty passing urine -redness, blistering, peeling or loosening of the skin, including inside the mouth -white patches or sores in the mouth -yellowing of the eyes or skin Side effects that usually do not require medical attention (report to your doctor or health care professional if they continue or are bothersome): -  diarrhea -dizziness, drowsiness -headache -stomach upset or vomiting -tooth discoloration -vaginal irritation This list may not describe all possible side effects. Call your doctor for medical advice  about side effects. You may report side effects to FDA at 1-800-FDA-1088. Where should I keep my medicine? Keep out of the reach of children. Store at room temperature between 15 and 30 degrees C (59 and 86 degrees F). Throw away any unused medicine after the expiration date. NOTE: This sheet is a summary. It may not cover all possible information. If you have questions about this medicine, talk to your doctor, pharmacist, or health care provider.  2018 Elsevier/Gold Standard (2015-04-06 15:26:03) Prednisone tablets What is this medicine? PREDNISONE (PRED ni sone) is a corticosteroid. It is commonly used to treat inflammation of the skin, joints, lungs, and other organs. Common conditions treated include asthma, allergies, and arthritis. It is also used for other conditions, such as blood disorders and diseases of the adrenal glands. This medicine may be used for other purposes; ask your health care provider or pharmacist if you have questions. COMMON BRAND NAME(S): Deltasone, Predone, Sterapred, Sterapred DS What should I tell my health care provider before I take this medicine? They need to know if you have any of these conditions: -Cushing's syndrome -diabetes -glaucoma -heart disease -high blood pressure -infection (especially a virus infection such as chickenpox, cold sores, or herpes) -kidney disease -liver disease -mental illness -myasthenia gravis -osteoporosis -seizures -stomach or intestine problems -thyroid disease -an unusual or allergic reaction to lactose, prednisone, other medicines, foods, dyes, or preservatives -pregnant or trying to get pregnant -breast-feeding How should I use this medicine? Take this medicine by mouth with a glass of water. Follow the directions on the prescription label. Take this medicine with food. If you are taking this medicine once a day, take it in the morning. Do not take more medicine than you are told to take. Do not suddenly stop taking  your medicine because you may develop a severe reaction. Your doctor will tell you how much medicine to take. If your doctor wants you to stop the medicine, the dose may be slowly lowered over time to avoid any side effects. Talk to your pediatrician regarding the use of this medicine in children. Special care may be needed. Overdosage: If you think you have taken too much of this medicine contact a poison control center or emergency room at once. NOTE: This medicine is only for you. Do not share this medicine with others. What if I miss a dose? If you miss a dose, take it as soon as you can. If it is almost time for your next dose, talk to your doctor or health care professional. You may need to miss a dose or take an extra dose. Do not take double or extra doses without advice. What may interact with this medicine? Do not take this medicine with any of the following medications: -metyrapone -mifepristone This medicine may also interact with the following medications: -aminoglutethimide -amphotericin B -aspirin and aspirin-like medicines -barbiturates -certain medicines for diabetes, like glipizide or glyburide -cholestyramine -cholinesterase inhibitors -cyclosporine -digoxin -diuretics -ephedrine -male hormones, like estrogens and birth control pills -isoniazid -ketoconazole -NSAIDS, medicines for pain and inflammation, like ibuprofen or naproxen -phenytoin -rifampin -toxoids -vaccines -warfarin This list may not describe all possible interactions. Give your health care provider a list of all the medicines, herbs, non-prescription drugs, or dietary supplements you use. Also tell them if you smoke, drink alcohol, or use illegal drugs. Some items  may interact with your medicine. What should I watch for while using this medicine? Visit your doctor or health care professional for regular checks on your progress. If you are taking this medicine over a prolonged period, carry an  identification card with your name and address, the type and dose of your medicine, and your doctor's name and address. This medicine may increase your risk of getting an infection. Tell your doctor or health care professional if you are around anyone with measles or chickenpox, or if you develop sores or blisters that do not heal properly. If you are going to have surgery, tell your doctor or health care professional that you have taken this medicine within the last twelve months. Ask your doctor or health care professional about your diet. You may need to lower the amount of salt you eat. This medicine may affect blood sugar levels. If you have diabetes, check with your doctor or health care professional before you change your diet or the dose of your diabetic medicine. What side effects may I notice from receiving this medicine? Side effects that you should report to your doctor or health care professional as soon as possible: -allergic reactions like skin rash, itching or hives, swelling of the face, lips, or tongue -changes in emotions or moods -changes in vision -depressed mood -eye pain -fever or chills, cough, sore throat, pain or difficulty passing urine -increased thirst -swelling of ankles, feet Side effects that usually do not require medical attention (report to your doctor or health care professional if they continue or are bothersome): -confusion, excitement, restlessness -headache -nausea, vomiting -skin problems, acne, thin and shiny skin -trouble sleeping -weight gain This list may not describe all possible side effects. Call your doctor for medical advice about side effects. You may report side effects to FDA at 1-800-FDA-1088. Where should I keep my medicine? Keep out of the reach of children. Store at room temperature between 15 and 30 degrees C (59 and 86 degrees F). Protect from light. Keep container tightly closed. Throw away any unused medicine after the expiration  date. NOTE: This sheet is a summary. It may not cover all possible information. If you have questions about this medicine, talk to your doctor, pharmacist, or health care provider.  2018 Elsevier/Gold Standard (2010-09-22 10:57:14) Acute Bronchitis, Adult Acute bronchitis is when air tubes (bronchi) in the lungs suddenly get swollen. The condition can make it hard to breathe. It can also cause these symptoms:  A cough.  Coughing up clear, yellow, or green mucus.  Wheezing.  Chest congestion.  Shortness of breath.  A fever.  Body aches.  Chills.  A sore throat.  Follow these instructions at home: Medicines  Take over-the-counter and prescription medicines only as told by your doctor.  If you were prescribed an antibiotic medicine, take it as told by your doctor. Do not stop taking the antibiotic even if you start to feel better. General instructions  Rest.  Drink enough fluids to keep your pee (urine) clear or pale yellow.  Avoid smoking and secondhand smoke. If you smoke and you need help quitting, ask your doctor. Quitting will help your lungs heal faster.  Use an inhaler, cool mist vaporizer, or humidifier as told by your doctor.  Keep all follow-up visits as told by your doctor. This is important. How is this prevented? To lower your risk of getting this condition again:  Wash your hands often with soap and water. If you cannot use soap and water, use  hand sanitizer.  Avoid contact with people who have cold symptoms.  Try not to touch your hands to your mouth, nose, or eyes.  Make sure to get the flu shot every year.  Contact a doctor if:  Your symptoms do not get better in 2 weeks. Get help right away if:  You cough up blood.  You have chest pain.  You have very bad shortness of breath.  You become dehydrated.  You faint (pass out) or keep feeling like you are going to pass out.  You keep throwing up (vomiting).  You have a very bad  headache.  Your fever or chills gets worse. This information is not intended to replace advice given to you by your health care provider. Make sure you discuss any questions you have with your health care provider. Document Released: 07/26/2007 Document Revised: 09/15/2015 Document Reviewed: 07/28/2015 Elsevier Interactive Patient Education  [employer].

## 2017-06-14 ENCOUNTER — Telehealth: Payer: Self-pay

## 2017-06-14 NOTE — Telephone Encounter (Signed)
Pt called our office stating that he was feeling much better after his Zpack and prednisone that was prescribed last week. He is concerned about his continued cough.  We instructed him to try Delsym OTC for his cough and if its not better in one week then he is to call us back to schedule a follow up appointment.

## 2017-12-24 ENCOUNTER — Other Ambulatory Visit: Payer: Self-pay

## 2017-12-24 DIAGNOSIS — Z0000 Encounter for general adult medical examination without abnormal findings: Secondary | ICD-10-CM

## 2017-12-25 ENCOUNTER — Other Ambulatory Visit: Payer: BLUE CROSS/BLUE SHIELD

## 2017-12-27 ENCOUNTER — Other Ambulatory Visit: Payer: BLUE CROSS/BLUE SHIELD

## 2017-12-27 DIAGNOSIS — Z0000 Encounter for general adult medical examination without abnormal findings: Secondary | ICD-10-CM

## 2017-12-28 LAB — CBC WITH DIFFERENTIAL/PLATELET
Basophils Absolute: 0 10*3/uL (ref 0.0–0.2)
Basos: 1 %
EOS (ABSOLUTE): 0.2 10*3/uL (ref 0.0–0.4)
EOS: 3 %
HEMATOCRIT: 44.6 % (ref 37.5–51.0)
Hemoglobin: 14.6 g/dL (ref 13.0–17.7)
IMMATURE GRANS (ABS): 0 10*3/uL (ref 0.0–0.1)
IMMATURE GRANULOCYTES: 1 %
LYMPHS: 33 %
Lymphocytes Absolute: 2.3 10*3/uL (ref 0.7–3.1)
MCH: 28.1 pg (ref 26.6–33.0)
MCHC: 32.7 g/dL (ref 31.5–35.7)
MCV: 86 fL (ref 79–97)
MONOCYTES: 9 %
MONOS ABS: 0.6 10*3/uL (ref 0.1–0.9)
NEUTROS PCT: 53 %
Neutrophils Absolute: 3.7 10*3/uL (ref 1.4–7.0)
Platelets: 320 10*3/uL (ref 150–450)
RBC: 5.19 x10E6/uL (ref 4.14–5.80)
RDW: 13 % (ref 12.3–15.4)
WBC: 6.8 10*3/uL (ref 3.4–10.8)

## 2017-12-28 LAB — URINALYSIS, ROUTINE W REFLEX MICROSCOPIC
BILIRUBIN UA: NEGATIVE
GLUCOSE, UA: NEGATIVE
Ketones, UA: NEGATIVE
Leukocytes, UA: NEGATIVE
Nitrite, UA: NEGATIVE
PROTEIN UA: NEGATIVE
RBC, UA: NEGATIVE
Specific Gravity, UA: 1.021 (ref 1.005–1.030)
UUROB: 0.2 mg/dL (ref 0.2–1.0)
pH, UA: 5.5 (ref 5.0–7.5)

## 2017-12-28 LAB — LIPID PANEL
CHOL/HDL RATIO: 3.4 ratio (ref 0.0–5.0)
Cholesterol, Total: 246 mg/dL — ABNORMAL HIGH (ref 100–199)
HDL: 73 mg/dL (ref >39)
LDL CALC: 158 mg/dL — AB (ref 0–99)
TRIGLYCERIDES: 75 mg/dL (ref 0–149)
VLDL Cholesterol Cal: 15 mg/dL (ref 5–40)

## 2017-12-28 LAB — COMPREHENSIVE METABOLIC PANEL
ALT: 27 IU/L (ref 0–44)
AST: 21 IU/L (ref 0–40)
Albumin/Globulin Ratio: 1.9 (ref 1.2–2.2)
Albumin: 4.8 g/dL (ref 3.5–5.5)
Alkaline Phosphatase: 58 IU/L (ref 39–117)
BUN/Creatinine Ratio: 10 (ref 9–20)
BUN: 9 mg/dL (ref 6–24)
Bilirubin Total: 0.3 mg/dL (ref 0.0–1.2)
CALCIUM: 9.9 mg/dL (ref 8.7–10.2)
CO2: 24 mmol/L (ref 20–29)
CREATININE: 0.9 mg/dL (ref 0.76–1.27)
Chloride: 100 mmol/L (ref 96–106)
GFR calc Af Amer: 114 mL/min/{1.73_m2} (ref >59)
GFR, EST NON AFRICAN AMERICAN: 99 mL/min/{1.73_m2} (ref >59)
GLOBULIN, TOTAL: 2.5 g/dL (ref 1.5–4.5)
Glucose: 103 mg/dL — ABNORMAL HIGH (ref 65–99)
Potassium: 4.3 mmol/L (ref 3.5–5.2)
SODIUM: 141 mmol/L (ref 134–144)
TOTAL PROTEIN: 7.3 g/dL (ref 6.0–8.5)

## 2017-12-28 LAB — PSA: Prostate Specific Ag, Serum: 0.5 ng/mL (ref 0.0–4.0)

## 2017-12-28 LAB — VITAMIN D 25 HYDROXY (VIT D DEFICIENCY, FRACTURES): VIT D 25 HYDROXY: 26 ng/mL — AB (ref 30.0–100.0)

## 2019-06-12 ENCOUNTER — Other Ambulatory Visit: Payer: Self-pay

## 2019-06-12 ENCOUNTER — Ambulatory Visit: Payer: BC Managed Care – PPO | Admitting: Dermatology

## 2019-06-12 DIAGNOSIS — Z1283 Encounter for screening for malignant neoplasm of skin: Secondary | ICD-10-CM | POA: Diagnosis not present

## 2019-06-12 DIAGNOSIS — L820 Inflamed seborrheic keratosis: Secondary | ICD-10-CM

## 2019-06-12 DIAGNOSIS — L578 Other skin changes due to chronic exposure to nonionizing radiation: Secondary | ICD-10-CM

## 2019-06-12 DIAGNOSIS — L817 Pigmented purpuric dermatosis: Secondary | ICD-10-CM | POA: Diagnosis not present

## 2019-06-12 DIAGNOSIS — L570 Actinic keratosis: Secondary | ICD-10-CM

## 2019-06-12 DIAGNOSIS — Z86018 Personal history of other benign neoplasm: Secondary | ICD-10-CM

## 2019-06-12 DIAGNOSIS — L814 Other melanin hyperpigmentation: Secondary | ICD-10-CM

## 2019-06-12 DIAGNOSIS — L821 Other seborrheic keratosis: Secondary | ICD-10-CM

## 2019-06-12 DIAGNOSIS — D229 Melanocytic nevi, unspecified: Secondary | ICD-10-CM

## 2019-06-12 DIAGNOSIS — Z85828 Personal history of other malignant neoplasm of skin: Secondary | ICD-10-CM

## 2019-06-12 MED ORDER — TRIAMCINOLONE ACETONIDE 0.1 % EX CREA: 1.0000 "application " | TOPICAL_CREAM | Freq: Two times a day (BID) | CUTANEOUS | 2 refills | Status: AC | PRN

## 2019-06-12 NOTE — Patient Instructions (Signed)

## 2019-06-12 NOTE — Progress Notes (Signed)
Follow-Up Visit   Subjective  Shane Pacheco is a 54 y.o. male who presents for the following: Annual Exam (yearly TBSE, hx of BCC, Hx of Dysplastic nevus ). The patient presents for mole check, skin cancer screening, and total-body skin exam.  The following portions of the chart were reviewed this encounter and updated as appropriate:  Tobacco  Allergies  Meds  Problems  Med Hx  Surg Hx  Fam Hx     Review of Systems:  No other skin or systemic complaints except as noted in HPI or Assessment and Plan.  Objective  Well appearing patient in no apparent distress; mood and affect are within normal limits.  A full examination was performed including scalp, head, eyes, ears, nose, lips, neck, chest, axillae, abdomen, back, buttocks, bilateral upper extremities, bilateral lower extremities, hands, feet, fingers, toes, fingernails, and toenails. All findings within normal limits unless otherwise noted below.  Objective  Head - Anterior (Face) (11): Erythematous thin papules/macules with gritty scale.   Objective  Left paranasal: Well healed scar with no evidence of recurrence.   Objective  back: Scar with no evidence of recurrence.   Objective  wrist, elbows, R leg (6): Erythematous keratotic or waxy stuck-on papule or plaque.   Objective  lower legs/ankles: Violaceous macules and patches.    Assessment & Plan  AK (actinic keratosis) (11) Head - Anterior (Face)  May discuss PDT at next office visit   Destruction of lesion - Head - Anterior (Face) Complexity: simple   Destruction method: cryotherapy   Informed consent: discussed and consent obtained   Timeout:  patient name, date of birth, surgical site, and procedure verified Lesion destroyed using liquid nitrogen: Yes   Region frozen until ice ball extended beyond lesion: Yes   Outcome: patient tolerated procedure well with no complications   Post-procedure details: wound care instructions given    History of  basal cell carcinoma (BCC) Left paranasal  Observe   History of dysplastic nevus back  Observe   Inflamed seborrheic keratosis (6) wrist, elbows, R leg  Destruction of lesion - wrist, elbows, R leg  Destruction method: cryotherapy   Timeout:  patient name, date of birth, surgical site, and procedure verified Lesion destroyed using liquid nitrogen: Yes   Region frozen until ice ball extended beyond lesion: Yes   Outcome: patient tolerated procedure well with no complications   Post-procedure details: wound care instructions given    Schamberg's purpura lower legs/ankles  Start Triamcinolone cream apply to skin qd-bid prn avoid f/g/a   Ordered Medications: triamcinolone cream (KENALOG) 0.1 %  Skin cancer screening   Skin cancer screening performed today.  Actinic Damage - diffuse scaly erythematous macules with underlying dyspigmentation - Recommend daily broad spectrum sunscreen SPF 30+ to sun-exposed areas, reapply every 2 hours as needed.  - Call for new or changing lesions. Melanocytic Nevi - Tan-brown and/or pink-flesh-colored symmetric macules and papules - Benign appearing on exam today - Observation - Call clinic for new or changing moles - Recommend daily use of broad spectrum spf 30+ sunscreen to sun-exposed areas.  Lentigines - Scattered tan macules - Discussed due to sun exposure - Benign, observe - Call for any changes Seborrheic Keratoses - Stuck-on, waxy, tan-brown papules and plaques  - Discussed benign etiology and prognosis. - Observe - Call for any changes Melanocytic Nevi - Tan-brown and/or pink-flesh-colored symmetric macules and papules - Benign appearing on exam today - Observation - Call clinic for new or changing moles - Recommend daily  use of broad spectrum spf 30+ sunscreen to sun-exposed areas.    Return in about 6 months (around 12/12/2019) for Aks .  IMarye Round, CMA, am acting as scribe for Sarina Ser, MD  .  Documentation: I have reviewed the above documentation for accuracy and completeness, and I agree with the above.  Sarina Ser, MD

## 2019-06-13 ENCOUNTER — Encounter: Payer: Self-pay | Admitting: Dermatology

## 2019-08-19 ENCOUNTER — Ambulatory Visit: Payer: BC Managed Care – PPO | Admitting: Podiatry

## 2019-08-19 ENCOUNTER — Encounter: Payer: Self-pay | Admitting: Podiatry

## 2019-08-19 ENCOUNTER — Other Ambulatory Visit: Payer: Self-pay

## 2019-08-19 DIAGNOSIS — M21622 Bunionette of left foot: Secondary | ICD-10-CM | POA: Diagnosis not present

## 2019-08-19 DIAGNOSIS — M2580 Other specified joint disorders, unspecified joint: Secondary | ICD-10-CM

## 2019-08-19 DIAGNOSIS — M7751 Other enthesopathy of right foot: Secondary | ICD-10-CM

## 2019-08-19 NOTE — Progress Notes (Signed)
   HPI: 54 y.o. male presenting today as a new patient for evaluation of bilateral foot pain.  Patient has a history of pain to the bilateral feet and also a history of plantar fascial pain.  Plantar fascial heel pain has been going for several years now.  He currently wears custom molded orthotics which helped significantly.  The orthotics actually alleviate his pain completely in his heels.  Today he is requesting new orthotics and presents for further treatment and evaluation  Past Medical History:  Diagnosis Date  . Allergic state   . Asthma without status asthmaticus   . Obsessive behavior      Physical Exam: General: The patient is alert and oriented x3 in no acute distress.  Dermatology: Skin is warm, dry and supple bilateral lower extremities. Negative for open lesions or macerations.  Vascular: Palpable pedal pulses bilaterally. No edema or erythema noted. Capillary refill within normal limits.  Neurological: Epicritic and protective threshold grossly intact bilaterally.   Musculoskeletal Exam: Range of motion within normal limits to all pedal and ankle joints bilateral. Muscle strength 5/5 in all groups bilateral.  There is some tenderness to palpation at the sesamoidal apparatus of the right foot first MTPJ.  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  Metatarsophalangeal capsulitis right with sesamoiditis 2.  Tailor's bunion left 3.  Plantar fasciitis bilateral   Plan of Care:  1. Patient evaluated. 2.  Appointment with Pedorthist for custom molded orthotics 3.  Return to clinic as needed  *Administration at [facility], DPM [facility]  Dr. Edrick Kins, DPM    [address], [place] [address]                Office [phone]  Fax [phone]

## 2019-08-20 ENCOUNTER — Other Ambulatory Visit: Payer: BC Managed Care – PPO | Admitting: Orthotics

## 2019-09-10 ENCOUNTER — Ambulatory Visit (INDEPENDENT_AMBULATORY_CARE_PROVIDER_SITE_OTHER): Payer: BC Managed Care – PPO | Admitting: Orthotics

## 2019-09-10 ENCOUNTER — Other Ambulatory Visit: Payer: Self-pay

## 2019-09-10 DIAGNOSIS — M21622 Bunionette of left foot: Secondary | ICD-10-CM | POA: Diagnosis not present

## 2019-09-10 DIAGNOSIS — M7751 Other enthesopathy of right foot: Secondary | ICD-10-CM

## 2019-09-10 DIAGNOSIS — M2580 Other specified joint disorders, unspecified joint: Secondary | ICD-10-CM

## 2019-09-10 NOTE — Progress Notes (Signed)
Patient cast today for CMFO orthotics per dr. Amalia Hailey to address capuslitis of mpj Rt foot;  He has previoulsy worn f/o with good results.  Repeating previous order

## 2019-09-26 ENCOUNTER — Telehealth: Payer: Self-pay | Admitting: *Deleted

## 2019-09-26 NOTE — Telephone Encounter (Signed)
Mrs Masten said they would pick them up next week.

## 2019-09-26 NOTE — Telephone Encounter (Signed)
I attempted to call the patient to schedule him an appointment to pick up orthotics.  His voicemail was full, I couldn't leave a message.

## 2019-12-17 ENCOUNTER — Ambulatory Visit: Payer: BC Managed Care – PPO | Admitting: Dermatology

## 2019-12-17 ENCOUNTER — Other Ambulatory Visit: Payer: Self-pay

## 2019-12-17 DIAGNOSIS — D1800 Hemangioma unspecified site: Secondary | ICD-10-CM

## 2019-12-17 DIAGNOSIS — Z86018 Personal history of other benign neoplasm: Secondary | ICD-10-CM | POA: Diagnosis not present

## 2019-12-17 DIAGNOSIS — L300 Nummular dermatitis: Secondary | ICD-10-CM

## 2019-12-17 DIAGNOSIS — L570 Actinic keratosis: Secondary | ICD-10-CM

## 2019-12-17 DIAGNOSIS — Z85828 Personal history of other malignant neoplasm of skin: Secondary | ICD-10-CM

## 2019-12-17 DIAGNOSIS — L821 Other seborrheic keratosis: Secondary | ICD-10-CM

## 2019-12-17 DIAGNOSIS — D229 Melanocytic nevi, unspecified: Secondary | ICD-10-CM

## 2019-12-17 DIAGNOSIS — L578 Other skin changes due to chronic exposure to nonionizing radiation: Secondary | ICD-10-CM

## 2019-12-17 DIAGNOSIS — L814 Other melanin hyperpigmentation: Secondary | ICD-10-CM

## 2019-12-17 DIAGNOSIS — Z1283 Encounter for screening for malignant neoplasm of skin: Secondary | ICD-10-CM

## 2019-12-17 MED ORDER — TRIAMCINOLONE ACETONIDE 0.1 % EX CREA: 1.0000 "application " | TOPICAL_CREAM | Freq: Every day | CUTANEOUS | 2 refills | Status: AC | PRN

## 2019-12-17 NOTE — Progress Notes (Signed)
Follow-Up Visit   Subjective  Shane Pacheco is a 54 y.o. male who presents for the following: Annual Exam (History of BCC and Dysplastic nevi, History of AKs - TBSE today). The patient presents for Total-Body Skin Exam (TBSE) for skin cancer screening and mole check.  The following portions of the chart were reviewed this encounter and updated as appropriate:  Tobacco  Allergies  Meds  Problems  Med Hx  Surg Hx  Fam Hx     Review of Systems:  No other skin or systemic complaints except as noted in HPI or Assessment and Plan.  Objective  Well appearing patient in no apparent distress; mood and affect are within normal limits.  A full examination was performed including scalp, head, eyes, ears, nose, lips, neck, chest, axillae, abdomen, back, buttocks, bilateral upper extremities, bilateral lower extremities, hands, feet, fingers, toes, fingernails, and toenails. All findings within normal limits unless otherwise noted below.  Objective  Face (3): Erythematous thin papules/macules with gritty scale.   Objective  Right Lower Leg - Anterior: Pink patch.   Assessment & Plan    Lentigines - Scattered tan macules - Discussed due to sun exposure - Benign, observe - Call for any changes  Seborrheic Keratoses - Stuck-on, waxy, tan-brown papules and plaques  - Discussed benign etiology and prognosis. - Observe - Call for any changes  Melanocytic Nevi - Tan-brown and/or pink-flesh-colored symmetric macules and papules - Benign appearing on exam today - Observation - Call clinic for new or changing moles - Recommend daily use of broad spectrum spf 30+ sunscreen to sun-exposed areas.   Hemangiomas - Red papules - Discussed benign nature - Observe - Call for any changes  Actinic Damage - diffuse scaly erythematous macules with underlying dyspigmentation - Recommend daily broad spectrum sunscreen SPF 30+ to sun-exposed areas, reapply every 2 hours as needed.  - Call  for new or changing lesions. - Discussed field treatment today. May consider in the future.  Skin cancer screening performed today.  History of Basal Cell Carcinoma of the Skin - No evidence of recurrence today - Recommend regular full body skin exams - Recommend daily broad spectrum sunscreen SPF 30+ to sun-exposed areas, reapply every 2 hours as needed.  - Call if any new or changing lesions are noted between office visits  History of Dysplastic Nevi - No evidence of recurrence today - Recommend regular full body skin exams - Recommend daily broad spectrum sunscreen SPF 30+ to sun-exposed areas, reapply every 2 hours as needed.  - Call if any new or changing lesions are noted between office visits  AK (actinic keratosis) (3) Face  Destruction of lesion - Face Complexity: simple   Destruction method: cryotherapy   Informed consent: discussed and consent obtained   Timeout:  patient name, date of birth, surgical site, and procedure verified Lesion destroyed using liquid nitrogen: Yes   Region frozen until ice ball extended beyond lesion: Yes   Outcome: patient tolerated procedure well with no complications   Post-procedure details: wound care instructions given    Nummular dermatitis,  Of the lower legs - possibly exacerbated by some stasis changes of the lower legs.  May worsen with cooler weather.  Lower Leg  TMC 0.1% cream bid to affected area when flared. Avoid face, groin, underarms Recommend Cerave cream to lower legs daily  triamcinolone cream (KENALOG) 0.1 % - Right Lower Leg - Anterior  Return in about 6 months (around 06/16/2020) for sun exposed areas.  I, Hollie  Marlou Sa, CMA, am acting as scribe for Sarina Ser, MD .  Documentation: I have reviewed the above documentation for accuracy and completeness, and I agree with the above.  Sarina Ser, MD

## 2019-12-17 NOTE — Patient Instructions (Addendum)
Wound Care Instructions  1. Cleanse wound gently with soap and water once a day then pat dry with clean gauze. Apply a thing coat of Petrolatum (petroleum jelly, "Vaseline") over the wound (unless you have an allergy to this). We recommend that you use a new, sterile tube of Vaseline. Do not pick or remove scabs. Do not remove the yellow or white "healing tissue" from the base of the wound.  2. Cover the wound with fresh, clean, nonstick gauze and secure with paper tape. You may use Band-Aids in place of gauze and tape if the would is small enough, but would recommend trimming much of the tape off as there is often too much. Sometimes Band-Aids can irritate the skin.  3. You should call the office for your biopsy report after 1 week if you have not already been contacted.  4. If you experience any problems, such as abnormal amounts of bleeding, swelling, significant bruising, significant pain, or evidence of infection, please call the office immediately.  5. FOR ADULT SURGERY PATIENTS: If you need something for pain relief you may take 1 extra strength Tylenol (acetaminophen) AND 2 Ibuprofen (200mg  each) together every 4 hours as needed for pain. (do not take these if you are allergic to them or if you have a reason you should not take them.) Typically, you may only need pain medication for 1 to 3 days.     Recommend Cerave cream to lower legs daily

## 2019-12-18 ENCOUNTER — Encounter: Payer: Self-pay | Admitting: Dermatology

## 2020-06-16 ENCOUNTER — Ambulatory Visit: Payer: BC Managed Care – PPO | Admitting: Dermatology

## 2020-06-16 ENCOUNTER — Other Ambulatory Visit: Payer: Self-pay

## 2020-06-16 DIAGNOSIS — Z85828 Personal history of other malignant neoplasm of skin: Secondary | ICD-10-CM | POA: Diagnosis not present

## 2020-06-16 DIAGNOSIS — L814 Other melanin hyperpigmentation: Secondary | ICD-10-CM | POA: Diagnosis not present

## 2020-06-16 DIAGNOSIS — Z1283 Encounter for screening for malignant neoplasm of skin: Secondary | ICD-10-CM

## 2020-06-16 DIAGNOSIS — D229 Melanocytic nevi, unspecified: Secondary | ICD-10-CM

## 2020-06-16 DIAGNOSIS — D1800 Hemangioma unspecified site: Secondary | ICD-10-CM

## 2020-06-16 DIAGNOSIS — Z86018 Personal history of other benign neoplasm: Secondary | ICD-10-CM

## 2020-06-16 DIAGNOSIS — L578 Other skin changes due to chronic exposure to nonionizing radiation: Secondary | ICD-10-CM

## 2020-06-16 DIAGNOSIS — L570 Actinic keratosis: Secondary | ICD-10-CM | POA: Diagnosis not present

## 2020-06-16 DIAGNOSIS — L821 Other seborrheic keratosis: Secondary | ICD-10-CM

## 2020-06-16 NOTE — Patient Instructions (Signed)

## 2020-06-16 NOTE — Progress Notes (Signed)
Follow-Up Visit   Subjective  Shane Pacheco is a 55 y.o. male who presents for the following: Other (History of BCC and Dysplastic nevi - Check sun exposed areas today). The patient presents for Upper Body Skin Exam (UBSE) for skin cancer screening and mole check.  The following portions of the chart were reviewed this encounter and updated as appropriate:   Tobacco  Allergies  Meds  Problems  Med Hx  Surg Hx  Fam Hx     Review of Systems:  No other skin or systemic complaints except as noted in HPI or Assessment and Plan.  Objective  Well appearing patient in no apparent distress; mood and affect are within normal limits.  All skin waist up examined.  Objective  Left Temple x 1, right forehead x 2, left medial cheek x 1, face x 2 (6): Erythematous thin papules/macules with gritty scale.    Assessment & Plan    Lentigines - Scattered tan macules - Due to sun exposure - Benign-appering, observe - Recommend daily broad spectrum sunscreen SPF 30+ to sun-exposed areas, reapply every 2 hours as needed. - Call for any changes  Seborrheic Keratoses - Stuck-on, waxy, tan-brown papules and/or plaques  - Benign-appearing - Discussed benign etiology and prognosis. - Observe - Call for any changes  Melanocytic Nevi - Tan-brown and/or pink-flesh-colored symmetric macules and papules - Benign appearing on exam today - Observation - Call clinic for new or changing moles - Recommend daily use of broad spectrum spf 30+ sunscreen to sun-exposed areas.   Hemangiomas - Red papules - Discussed benign nature - Observe - Call for any changes  Actinic Damage - Chronic condition, secondary to cumulative UV/sun exposure - diffuse scaly erythematous macules with underlying dyspigmentation - Recommend daily broad spectrum sunscreen SPF 30+ to sun-exposed areas, reapply every 2 hours as needed.  - Staying in the shade or wearing long sleeves, sun glasses (UVA+UVB protection) and  wide brim hats (4-inch brim around the entire circumference of the hat) are also recommended for sun protection.  - Call for new or changing lesions.  Skin cancer screening performed today.  History of Basal Cell Carcinoma of the Skin - No evidence of recurrence today - Recommend regular full body skin exams - Recommend daily broad spectrum sunscreen SPF 30+ to sun-exposed areas, reapply every 2 hours as needed.  - Call if any new or changing lesions are noted between office visits  History of Dysplastic Nevi - No evidence of recurrence today - Recommend regular full body skin exams - Recommend daily broad spectrum sunscreen SPF 30+ to sun-exposed areas, reapply every 2 hours as needed.  - Call if any new or changing lesions are noted between office visits  AK (actinic keratosis) (6) Left Temple x 1, right forehead x 2, left medial cheek x 1, face x 2  Destruction of lesion - Left Temple x 1, right forehead x 2, left medial cheek x 1, face x 2 Complexity: simple   Destruction method: cryotherapy   Informed consent: discussed and consent obtained   Timeout:  patient name, date of birth, surgical site, and procedure verified Lesion destroyed using liquid nitrogen: Yes   Region frozen until ice ball extended beyond lesion: Yes   Outcome: patient tolerated procedure well with no complications   Post-procedure details: wound care instructions given    Return in about 1 year (around 06/16/2021) for TBSE.  I, Ashok Cordia, CMA, am acting as scribe for Sarina Ser, MD .  Documentation:  I have reviewed the above documentation for accuracy and completeness, and I agree with the above.  Sarina Ser, MD

## 2020-06-20 ENCOUNTER — Encounter: Payer: Self-pay | Admitting: Dermatology

## 2020-09-22 ENCOUNTER — Other Ambulatory Visit: Payer: Self-pay

## 2020-09-22 ENCOUNTER — Other Ambulatory Visit: Payer: BC Managed Care – PPO

## 2020-09-22 DIAGNOSIS — Z0000 Encounter for general adult medical examination without abnormal findings: Secondary | ICD-10-CM

## 2020-09-23 LAB — URINALYSIS, ROUTINE W REFLEX MICROSCOPIC
Bilirubin, UA: NEGATIVE
Glucose, UA: NEGATIVE
Ketones, UA: NEGATIVE
Leukocytes,UA: NEGATIVE
Nitrite, UA: NEGATIVE
Protein,UA: NEGATIVE
RBC, UA: NEGATIVE
Specific Gravity, UA: 1.025 (ref 1.005–1.030)
Urobilinogen, Ur: 0.2 mg/dL (ref 0.2–1.0)
pH, UA: 5.5 (ref 5.0–7.5)

## 2020-09-23 LAB — PSA: Prostate Specific Ag, Serum: 0.7 ng/mL (ref 0.0–4.0)

## 2020-09-23 LAB — COMPREHENSIVE METABOLIC PANEL
ALT: 19 IU/L (ref 0–44)
AST: 16 IU/L (ref 0–40)
Albumin/Globulin Ratio: 2 (ref 1.2–2.2)
Albumin: 4.7 g/dL (ref 3.8–4.9)
Alkaline Phosphatase: 65 IU/L (ref 44–121)
BUN/Creatinine Ratio: 14 (ref 9–20)
BUN: 13 mg/dL (ref 6–24)
Bilirubin Total: 0.5 mg/dL (ref 0.0–1.2)
CO2: 24 mmol/L (ref 20–29)
Calcium: 9.6 mg/dL (ref 8.7–10.2)
Chloride: 102 mmol/L (ref 96–106)
Creatinine, Ser: 0.94 mg/dL (ref 0.76–1.27)
Globulin, Total: 2.4 g/dL (ref 1.5–4.5)
Glucose: 104 mg/dL — ABNORMAL HIGH (ref 65–99)
Potassium: 4.4 mmol/L (ref 3.5–5.2)
Sodium: 140 mmol/L (ref 134–144)
Total Protein: 7.1 g/dL (ref 6.0–8.5)
eGFR: 97 mL/min/{1.73_m2} (ref >59)

## 2020-09-23 LAB — LIPID PANEL
Chol/HDL Ratio: 3.1 ratio (ref 0.0–5.0)
Cholesterol, Total: 238 mg/dL — ABNORMAL HIGH (ref 100–199)
HDL: 77 mg/dL (ref >39)
LDL Chol Calc (NIH): 139 mg/dL — ABNORMAL HIGH (ref 0–99)
Triglycerides: 125 mg/dL (ref 0–149)
VLDL Cholesterol Cal: 22 mg/dL (ref 5–40)

## 2020-09-23 LAB — CBC WITH DIFFERENTIAL/PLATELET
Basophils Absolute: 0 10*3/uL (ref 0.0–0.2)
Basos: 1 %
EOS (ABSOLUTE): 0.4 10*3/uL (ref 0.0–0.4)
Eos: 6 %
Hematocrit: 45 % (ref 37.5–51.0)
Hemoglobin: 14.6 g/dL (ref 13.0–17.7)
Immature Grans (Abs): 0 10*3/uL (ref 0.0–0.1)
Immature Granulocytes: 1 %
Lymphocytes Absolute: 1.8 10*3/uL (ref 0.7–3.1)
Lymphs: 30 %
MCH: 28.1 pg (ref 26.6–33.0)
MCHC: 32.4 g/dL (ref 31.5–35.7)
MCV: 87 fL (ref 79–97)
Monocytes Absolute: 0.5 10*3/uL (ref 0.1–0.9)
Monocytes: 8 %
Neutrophils Absolute: 3.3 10*3/uL (ref 1.4–7.0)
Neutrophils: 54 %
Platelets: 286 10*3/uL (ref 150–450)
RBC: 5.19 x10E6/uL (ref 4.14–5.80)
RDW: 13.2 % (ref 11.6–15.4)
WBC: 6.1 10*3/uL (ref 3.4–10.8)

## 2020-09-23 LAB — TSH: TSH: 2.38 u[IU]/mL (ref 0.450–4.500)

## 2021-01-06 ENCOUNTER — Telehealth: Payer: Self-pay

## 2021-01-06 MED ORDER — CLINDAMYCIN PHOSPHATE 1 % EX SOLN: Freq: Two times a day (BID) | CUTANEOUS | 0 refills | Status: AC

## 2021-01-06 MED ORDER — DOXYCYCLINE HYCLATE 100 MG PO TABS: 100.0000 mg | ORAL_TABLET | Freq: Two times a day (BID) | ORAL | 0 refills | Status: AC

## 2021-01-06 NOTE — Telephone Encounter (Signed)
Patient advised and medication sent in for him. aw

## 2021-01-06 NOTE — Telephone Encounter (Signed)
Patient left a message that he currently has a irritation/infection from shaving. He states either you or Dr. Tyler Deis treated this several years ago and was prescribed medication to help. He is currently headed out of the country this weekend and desperate for medication to help with this before.

## 2021-06-22 ENCOUNTER — Ambulatory Visit: Payer: BC Managed Care – PPO | Admitting: Dermatology

## 2021-06-22 DIAGNOSIS — Z85828 Personal history of other malignant neoplasm of skin: Secondary | ICD-10-CM

## 2021-06-22 DIAGNOSIS — L578 Other skin changes due to chronic exposure to nonionizing radiation: Secondary | ICD-10-CM | POA: Diagnosis not present

## 2021-06-22 DIAGNOSIS — L821 Other seborrheic keratosis: Secondary | ICD-10-CM

## 2021-06-22 DIAGNOSIS — L570 Actinic keratosis: Secondary | ICD-10-CM | POA: Diagnosis not present

## 2021-06-22 DIAGNOSIS — L814 Other melanin hyperpigmentation: Secondary | ICD-10-CM

## 2021-06-22 DIAGNOSIS — Z1283 Encounter for screening for malignant neoplasm of skin: Secondary | ICD-10-CM | POA: Diagnosis not present

## 2021-06-22 DIAGNOSIS — Z86018 Personal history of other benign neoplasm: Secondary | ICD-10-CM | POA: Diagnosis not present

## 2021-06-22 DIAGNOSIS — D1800 Hemangioma unspecified site: Secondary | ICD-10-CM

## 2021-06-22 DIAGNOSIS — D229 Melanocytic nevi, unspecified: Secondary | ICD-10-CM

## 2021-06-22 MED ORDER — FLUOROURACIL 5 % EX CREA
TOPICAL_CREAM | CUTANEOUS | 0 refills | Status: AC
Start: 2021-06-22 — End: ?

## 2021-06-22 NOTE — Patient Instructions (Addendum)
Start 5-fluorouracil/calcipotriene cream twice a day for 7 days to affected areas including the forehead, temples, and cheeks. Prescription sent to [facility]. Patient provided with contact information for pharmacy and advised the pharmacy will mail the prescription to their home. Patient provided with handout reviewing treatment course and side effects and advised to call or message [place] on MyChart with any concerns. ? ? ?5-Fluorouracil/Calcipotriene Patient Education  ? ?Actinic keratoses are the dry, red scaly spots on the skin caused by sun damage. A portion of these spots can turn into skin cancer with time, and treating them can help prevent development of skin cancer.  ? ?Treatment of these spots requires removal of the defective skin cells. There are various ways to remove actinic keratoses, including freezing with liquid nitrogen, treatment with creams, or treatment with a blue light procedure in the office.  ? ?5-fluorouracil cream is a topical cream used to treat actinic keratoses. It works by interfering with the growth of abnormal fast-growing skin cells, such as actinic keratoses. These cells peel off and are replaced by healthy ones.  ? ?5-fluorouracil/calcipotriene is a combination of the 5-fluorouracil cream with a vitamin D analog cream called calcipotriene. The calcipotriene alone does not treat actinic keratoses. However, when it is combined with 5-fluorouracil, it helps the 5-fluorouracil treat the actinic keratoses much faster so that the same results can be achieved with a much shorter treatment time. ? ?INSTRUCTIONS FOR 5-FLUOROURACIL/CALCIPOTRIENE CREAM:  ? ?5-fluorouracil/calcipotriene cream typically only needs to be used for 4-7 days. A thin layer should be applied twice a day to the treatment areas recommended by your physician.  ? ?If your physician prescribed you separate tubes of 5-fluourouracil and calcipotriene, apply a thin layer of 5-fluorouracil followed by a thin layer of  calcipotriene.  ? ?Avoid contact with your eyes, nostrils, and mouth. Do not use 5-fluorouracil/calcipotriene cream on infected or open wounds.  ? ?You will develop redness, irritation and some crusting at areas where you have pre-cancer damage/actinic keratoses. IF YOU DEVELOP PAIN, BLEEDING, OR SIGNIFICANT CRUSTING, STOP THE TREATMENT EARLY - you have already gotten a good response and the actinic keratoses should clear up well. ? ?Wash your hands after applying 5-fluorouracil 5% cream on your skin.  ? ?A moisturizer or sunscreen with a minimum SPF 30 should be applied each morning.  ? ?Once you have finished the treatment, you can apply a thin layer of Vaseline twice a day to irritated areas to soothe and calm the areas more quickly. If you experience significant discomfort, contact your physician. ? ?For some patients it is necessary to repeat the treatment for best results. ? ?SIDE EFFECTS: When using 5-fluorouracil/calcipotriene cream, you may have mild irritation, such as redness, dryness, swelling, or a mild burning sensation. This usually resolves within 2 weeks. The more actinic keratoses you have, the more redness and inflammation you can expect during treatment. Eye irritation has been reported rarely. If this occurs, please let us know.  ? ?If you have any trouble using this cream, please call the office. If you have any other questions about this information, please do not hesitate to ask me before you leave the office. ? ?5-fluorouracil/calcipotriene cream is is a type of field treatment used to treat precancers, thin skin cancers, and areas of sun damage. Expected reaction includes irritation and mild inflammation potentially progressing to more severe inflammation including redness, scaling, crusting and open sores/erosions.  If too much irritation occurs, ensure application of only a thin layer and decrease frequency  of use to achieve a tolerable level of inflammation. Recommend applying Vaseline  ointment to open sores as needed.  Minimize sun exposure while under treatment. Recommend daily broad spectrum sunscreen SPF 30+ to sun-exposed areas, reapply every 2 hours as needed.  ? ?  ? ?If You Need Anything After Your Visit ? ?If you have any questions or concerns for your doctor, please call our main line at [phone] and press option 4 to reach your doctor's medical assistant. If no one answers, please leave a voicemail as directed and we will return your call as soon as possible. Messages left after 4 pm will be answered the following business day.  ? ?You may also send [place] a message via MyChart. We typically respond to MyChart messages within 1-2 business days. ? ?For prescription refills, please ask your pharmacy to contact our office. Our fax number is [phone]. ? ?If you have an urgent issue when the clinic is closed that cannot wait until the next business day, you can page your doctor at the number below.   ? ?Please note that while we do our best to be available for urgent issues outside of office hours, we are not available 24/7.  ? ?If you have an urgent issue and are unable to reach [place], you may choose to seek medical care at your doctor's office, retail clinic, urgent care center, or emergency room. ? ?If you have a medical emergency, please immediately call 911 or go to the emergency department. ? ?Pager Numbers ? ?- Dr. Nehemiah Massed: [phone] ? ?- Dr. Laurence Ferrari: [phone] ? ?- Dr. Nicole Kindred: [phone] ? ?In the event of inclement weather, please call our main line at [phone] for an update on the status of any delays or closures. ? ?Dermatology Medication Tips: ?Please keep the boxes that topical medications come in in order to help keep track of the instructions about where and how to use these. Pharmacies typically print the medication instructions only on the boxes and not directly on the medication tubes.  ? ?If your medication is too expensive, please contact our office at  [phone] option 4 or send [place] a message through [place].  ? ?We are unable to tell what your co-pay for medications will be in advance as this is different depending on your insurance coverage. However, we may be able to find a substitute medication at lower cost or fill out paperwork to get insurance to cover a needed medication.  ? ?If a prior authorization is required to get your medication covered by your insurance company, please allow [place] 1-2 business days to complete this process. ? ?Drug prices often vary depending on where the prescription is filled and some pharmacies may offer cheaper prices. ? ?The website www.goodrx.com contains coupons for medications through different pharmacies. The prices here do not account for what the cost may be with help from insurance (it may be cheaper with your insurance), but the website can give you the price if you did not use any insurance.  ?- You can print the associated coupon and take it with your prescription to the pharmacy.  ?- You may also stop by our office during regular business hours and pick up a GoodRx coupon card.  ?- If you need your prescription sent electronically to a different pharmacy, notify our office through [facility] or by phone at [phone] option 4. ? ? ? ? ?Si Usted Necesita Algo Despu?s de Su Visita ? ?Tambi?n puede enviarnos un mensaje a trav?s de  MyChart. Por lo general respondemos a los mensajes de MyChart en el transcurso de 1 a 2 d?as h?biles. ? ?Para renovar recetas, por favor pida a su farmacia que se ponga en contacto con nuestra oficina. Nuestro n?mero de fax es el [phone]. ? ?Si tiene un asunto urgente cuando la cl?nica est? cerrada y que no puede esperar hasta el siguiente d?a h?bil, puede llamar/localizar a su doctor(a) al n?mero que aparece a continuaci?n.  ? ?Por favor, tenga en cuenta que aunque hacemos todo lo posible para estar disponibles para asuntos urgentes fuera del horario de oficina, no estamos  disponibles las 24 horas del d?a, los 7 d?as de la semana.  ? ?Si tiene un problema urgente y no puede comunicarse con nosotros, puede optar por buscar atenci?n m?dica  en el consultorio de su doctor(a), en una cl

## 2021-06-22 NOTE — Progress Notes (Signed)
? ?Follow-Up Visit ?  ?Subjective  ?Shane Pacheco is a 56 y.o. male who presents for the following: Annual Exam (Hx BCC, dysplastic nevi, AK's). The patient presents for Total-Body Skin Exam (TBSE) for skin cancer screening and mole check.  The patient has spots, moles and lesions to be evaluated, some may be new or changing. ? ?The following portions of the chart were reviewed this encounter and updated as appropriate:  ? Tobacco  Allergies  Meds  Problems  Med Hx  Surg Hx  Fam Hx   ?  ?Review of Systems:  No other skin or systemic complaints except as noted in HPI or Assessment and Plan. ? ?Objective  ?Well appearing patient in no apparent distress; mood and affect are within normal limits. ? ?A full examination was performed including scalp, head, eyes, ears, nose, lips, neck, chest, axillae, abdomen, back, buttocks, bilateral upper extremities, bilateral lower extremities, hands, feet, fingers, toes, fingernails, and toenails. All findings within normal limits unless otherwise noted below. ? ?R forehead x 1 , R ear x 1 (2) ?Erythematous thin papules/macules with gritty scale.  ? ? ?Assessment & Plan  ?AK (actinic keratosis) (2) ?R forehead x 1 , R ear x 1 ? ?Destruction of lesion - R forehead x 1 , R ear x 1 ?Complexity: simple   ?Destruction method: cryotherapy   ?Informed consent: discussed and consent obtained   ?Timeout:  patient name, date of birth, surgical site, and procedure verified ?Lesion destroyed using liquid nitrogen: Yes   ?Region frozen until ice ball extended beyond lesion: Yes   ?Outcome: patient tolerated procedure well with no complications   ?Post-procedure details: wound care instructions given   ? ?fluorouracil (EFUDEX) 5 % cream - R forehead x 1 , R ear x 1 ?Apply to the forehead, temples, and cheeks BID x 7 days. ? ?Skin cancer screening ? ?Lentigines ?- Scattered tan macules ?- Due to sun exposure ?- Benign-appearing, observe ?- Recommend daily broad spectrum sunscreen SPF 30+  to sun-exposed areas, reapply every 2 hours as needed. ?- Call for any changes ? ?Seborrheic Keratoses ?- Stuck-on, waxy, tan-brown papules and/or plaques  ?- Benign-appearing ?- Discussed benign etiology and prognosis. ?- Observe ?- Call for any changes ? ?Melanocytic Nevi ?- Tan-brown and/or pink-flesh-colored symmetric macules and papules ?- Benign appearing on exam today ?- Observation ?- Call clinic for new or changing moles ?- Recommend daily use of broad spectrum spf 30+ sunscreen to sun-exposed areas.  ? ?Hemangiomas ?- Red papules ?- Discussed benign nature ?- Observe ?- Call for any changes ? ?Actinic Damage - Severe, confluent actinic changes with pre-cancerous actinic keratoses  ?- Severe, chronic, not at goal, secondary to cumulative UV radiation exposure over time ?- diffuse scaly erythematous macules and papules with underlying dyspigmentation ?- Discussed Prescription "Field Treatment" for Severe, Chronic Confluent Actinic Changes with Pre-Cancerous Actinic Keratoses ?Field treatment involves treatment of an entire area of skin that has confluent Actinic Changes (Sun/ Ultraviolet light damage) and PreCancerous Actinic Keratoses by method of PhotoDynamic Therapy (PDT) and/or prescription Topical Chemotherapy agents such as 5-fluorouracil, 5-fluorouracil/calcipotriene, and/or imiquimod.  The purpose is to decrease the number of clinically evident and subclinical PreCancerous lesions to prevent progression to development of skin cancer by chemically destroying early precancer changes that may or may not be visible.  It has been shown to reduce the risk of developing skin cancer in the treated area. As a result of treatment, redness, scaling, crusting, and open sores may occur during treatment  course. One or more than one of these methods may be used and may have to be used several times to control, suppress and eliminate the PreCancerous changes. Discussed treatment course, expected reaction, and  possible side effects. ?- Recommend daily broad spectrum sunscreen SPF 30+ to sun-exposed areas, reapply every 2 hours as needed.  ?- Staying in the shade or wearing long sleeves, sun glasses (UVA+UVB protection) and wide brim hats (4-inch brim around the entire circumference of the hat) are also recommended. ?- Call for new or changing lesions. ?- Start 5FU/Calcipotriene mix BID x 7 days to the forehead, temples, and cheeks.  ? ?History of Basal Cell Carcinoma of the Skin ?- No evidence of recurrence today ?- Recommend regular full body skin exams ?- Recommend daily broad spectrum sunscreen SPF 30+ to sun-exposed areas, reapply every 2 hours as needed.  ?- Call if any new or changing lesions are noted between office visits ? ?History of Dysplastic Nevi ?- No evidence of recurrence today ?- Recommend regular full body skin exams ?- Recommend daily broad spectrum sunscreen SPF 30+ to sun-exposed areas, reapply every 2 hours as needed.  ?- Call if any new or changing lesions are noted between office visits ? ?Skin cancer screening performed today. ? ?Return in about 1 year (around 06/23/2022) for TBSE. ? ?I, Rudell Cobb, CMA, am acting as scribe for Sarina Ser, MD . ?Documentation: I have reviewed the above documentation for accuracy and completeness, and I agree with the above. ? ?Sarina Ser, MD ? ?

## 2021-07-05 ENCOUNTER — Encounter: Payer: Self-pay | Admitting: Dermatology
